# Patient Record
Sex: Female | Born: 1955
Health system: Southern US, Community
[De-identification: ages and names within clinical notes are randomized; demographics above are authoritative.]

---

## 2009-01-28 ENCOUNTER — Encounter: Admission: RE | Admit: 2009-01-28 | Discharge: 2009-01-28 | Payer: Self-pay | Admitting: Gynecology

## 2010-01-29 ENCOUNTER — Encounter: Admission: RE | Admit: 2010-01-29 | Discharge: 2010-01-29 | Payer: Self-pay | Admitting: Gynecology

## 2010-04-13 ENCOUNTER — Encounter: Payer: Self-pay | Admitting: Gynecology

## 2010-12-30 ENCOUNTER — Other Ambulatory Visit: Payer: Self-pay | Admitting: Family Medicine

## 2010-12-30 DIAGNOSIS — N63 Unspecified lump in unspecified breast: Secondary | ICD-10-CM

## 2010-12-30 DIAGNOSIS — R2230 Localized swelling, mass and lump, unspecified upper limb: Secondary | ICD-10-CM

## 2010-12-30 DIAGNOSIS — R1032 Left lower quadrant pain: Secondary | ICD-10-CM

## 2010-12-31 ENCOUNTER — Ambulatory Visit
Admission: RE | Admit: 2010-12-31 | Discharge: 2010-12-31 | Disposition: A | Discharge status: Home / Self Care | Payer: BC Managed Care – PPO | Source: Ambulatory Visit | Attending: Family Medicine | Admitting: Family Medicine

## 2010-12-31 DIAGNOSIS — R1032 Left lower quadrant pain: Secondary | ICD-10-CM

## 2011-01-12 ENCOUNTER — Other Ambulatory Visit: Payer: Self-pay

## 2011-01-12 ENCOUNTER — Other Ambulatory Visit: Payer: Self-pay | Admitting: Gynecology

## 2011-01-12 DIAGNOSIS — Z1231 Encounter for screening mammogram for malignant neoplasm of breast: Secondary | ICD-10-CM

## 2011-02-02 ENCOUNTER — Ambulatory Visit
Admission: RE | Admit: 2011-02-02 | Discharge: 2011-02-02 | Disposition: A | Discharge status: Home / Self Care | Payer: BC Managed Care – PPO | Source: Ambulatory Visit | Attending: Gynecology | Admitting: Gynecology

## 2011-02-02 DIAGNOSIS — Z1231 Encounter for screening mammogram for malignant neoplasm of breast: Secondary | ICD-10-CM

## 2012-01-05 ENCOUNTER — Other Ambulatory Visit: Payer: Self-pay | Admitting: Obstetrics and Gynecology

## 2012-01-05 DIAGNOSIS — Z1231 Encounter for screening mammogram for malignant neoplasm of breast: Secondary | ICD-10-CM

## 2012-02-03 ENCOUNTER — Ambulatory Visit
Admission: RE | Admit: 2012-02-03 | Discharge: 2012-02-03 | Disposition: A | Discharge status: Home / Self Care | Payer: BC Managed Care – PPO | Source: Ambulatory Visit | Attending: Obstetrics and Gynecology | Admitting: Obstetrics and Gynecology

## 2012-02-03 DIAGNOSIS — Z1231 Encounter for screening mammogram for malignant neoplasm of breast: Secondary | ICD-10-CM

## 2012-12-28 ENCOUNTER — Other Ambulatory Visit: Payer: Self-pay

## 2012-12-28 DIAGNOSIS — Z1231 Encounter for screening mammogram for malignant neoplasm of breast: Secondary | ICD-10-CM

## 2013-01-05 ENCOUNTER — Ambulatory Visit (INDEPENDENT_AMBULATORY_CARE_PROVIDER_SITE_OTHER): Payer: BC Managed Care – PPO | Admitting: *Deleted

## 2013-01-05 DIAGNOSIS — Z23 Encounter for immunization: Secondary | ICD-10-CM

## 2013-02-07 ENCOUNTER — Ambulatory Visit
Admission: RE | Admit: 2013-02-07 | Discharge: 2013-02-07 | Disposition: A | Discharge status: Home / Self Care | Payer: BC Managed Care – PPO | Source: Ambulatory Visit

## 2013-02-07 DIAGNOSIS — Z1231 Encounter for screening mammogram for malignant neoplasm of breast: Secondary | ICD-10-CM

## 2013-10-11 ENCOUNTER — Ambulatory Visit (INDEPENDENT_AMBULATORY_CARE_PROVIDER_SITE_OTHER): Payer: BC Managed Care – PPO | Admitting: Physician Assistant

## 2013-10-11 ENCOUNTER — Encounter: Payer: Self-pay | Admitting: Physician Assistant

## 2013-10-11 VITALS — BP 108/70 | HR 68 | Temp 97.6°F | Resp 18 | Ht 64.0 in | Wt 117.0 lb

## 2013-10-11 DIAGNOSIS — R1900 Intra-abdominal and pelvic swelling, mass and lump, unspecified site: Secondary | ICD-10-CM

## 2013-10-11 DIAGNOSIS — H6120 Impacted cerumen, unspecified ear: Secondary | ICD-10-CM

## 2013-10-11 DIAGNOSIS — H6122 Impacted cerumen, left ear: Secondary | ICD-10-CM

## 2013-10-11 LAB — CBC WITH DIFFERENTIAL/PLATELET
BASOS ABS: 0.1 10*3/uL (ref 0.0–0.1)
BASOS PCT: 1 % (ref 0–1)
Eosinophils Absolute: 0.1 10*3/uL (ref 0.0–0.7)
Eosinophils Relative: 1 % (ref 0–5)
HEMATOCRIT: 37.9 % (ref 36.0–46.0)
HEMOGLOBIN: 12.7 g/dL (ref 12.0–15.0)
LYMPHS PCT: 30 % (ref 12–46)
Lymphs Abs: 2.6 10*3/uL (ref 0.7–4.0)
MCH: 29.1 pg (ref 26.0–34.0)
MCHC: 33.5 g/dL (ref 30.0–36.0)
MCV: 86.7 fL (ref 78.0–100.0)
MONO ABS: 0.6 10*3/uL (ref 0.1–1.0)
Monocytes Relative: 7 % (ref 3–12)
NEUTROS ABS: 5.2 10*3/uL (ref 1.7–7.7)
NEUTROS PCT: 61 % (ref 43–77)
Platelets: 283 10*3/uL (ref 150–400)
RBC: 4.37 MIL/uL (ref 3.87–5.11)
RDW: 13.3 % (ref 11.5–15.5)
WBC: 8.5 10*3/uL (ref 4.0–10.5)

## 2013-10-11 LAB — COMPLETE METABOLIC PANEL WITH GFR
ALBUMIN: 4.2 g/dL (ref 3.5–5.2)
ALK PHOS: 68 U/L (ref 39–117)
ALT: 12 U/L (ref 0–35)
AST: 17 U/L (ref 0–37)
BUN: 12 mg/dL (ref 6–23)
CALCIUM: 9.7 mg/dL (ref 8.4–10.5)
CHLORIDE: 101 meq/L (ref 96–112)
CO2: 26 mEq/L (ref 19–32)
Creat: 0.87 mg/dL (ref 0.50–1.10)
GFR, Est African American: 84 mL/min
GFR, Est Non African American: 73 mL/min
Glucose, Bld: 101 mg/dL — ABNORMAL HIGH (ref 70–99)
POTASSIUM: 4.2 meq/L (ref 3.5–5.3)
SODIUM: 136 meq/L (ref 135–145)
TOTAL PROTEIN: 6.5 g/dL (ref 6.0–8.3)
Total Bilirubin: 0.4 mg/dL (ref 0.2–1.2)

## 2013-10-11 NOTE — Progress Notes (Signed)
Patient ID: Stacy Day MRN: [id number], DOB: 17-Feb-1956, 58 y.o. Date of Encounter: @DATE @  Chief Complaint:  Chief Complaint  Patient presents with  . Otalgia    feels like there is fluid in it.  . right hip issue    felt bulge in when shower and sometimes moves to navel    HPI: 58 y.o. year old white female  presents with above concerns.  She says that > 2 months ago she had symptoms of allergies or a cold. Says that all the symptoms resolved except that her left ear has felt clogged off and on since then. Also notes that she has had to come here and get wax cleaned from her ears in the past.  Also reports that about 1-1/2 weeks ago she was in the shower and noticed that the right side of her abdomen seemed to protrude farther out than the left side. She has not been able to palpate any distinct mass. Has had no pain in the area. Has had no change in her bowel habits. No hematochezia and no melena. No significant change in her weight or unintentional weight loss. Says that she has had multiple colonoscopies.   History reviewed. No pertinent past medical history.   Home Meds: No outpatient prescriptions prior to visit.   No facility-administered medications prior to visit.    Allergies: No Known Allergies  History   Social History  . Marital Status: Single    Spouse Name: N/A    Number of Children: N/A  . Years of Education: N/A   Occupational History  . Not on file.   Social History Main Topics  . Smoking status: Never Smoker   . Smokeless tobacco: Not on file  . Alcohol Use: Not on file  . Drug Use: Not on file  . Sexual Activity: Not on file   Other Topics Concern  . Not on file   Social History Narrative  . No narrative on file    No family history on file.   Review of Systems:  See HPI for pertinent ROS. All other ROS negative.    Physical Exam: Blood pressure 108/70, pulse 68, temperature 97.6 F (36.4 C), temperature source Oral, resp. rate  18, height 5\' 4"  (1.626 m), weight 117 lb (53.071 kg)., Body mass index is 20.07 kg/(m^2). General: WNWD WF. Appears in no acute distress. Head: Normocephalic, atraumatic, eyes without discharge, sclera non-icteric, nares are without discharge.  Right Ear: Canal is patent and normal. Tympanic membrane is normal. Left ear: Canal is obstructed by cerumen. Neck: Supple. No thyromegaly. No lymphadenopathy. Lungs: Clear bilaterally to auscultation without wheezes, rales, or rhonchi. Breathing is unlabored. Heart: RRR with S1 S2. No murmurs, rubs, or gallops. Abdomen: Soft, non-tender, non-distended with normoactive bowel sounds. No hepatomegaly. No rebound/guarding. With palpation, I palpate no mass.  However, on inspection, with pt standing, the right side of her abdomen does protrude out further than on the left side.  Musculoskeletal:  Strength and tone normal for age. Forward Bend: No scoliosis. Appears symmetrical from the back. I see no sign of leg length descrepency, etc.  Extremities/Skin: Warm and dry. No edema.  Neuro: Alert and oriented X 3. Moves all extremities spontaneously. Gait is normal. CNII-XII grossly in tact. Psych:  Responds to questions appropriately with a normal affect.     ASSESSMENT AND PLAN:  58 y.o. year old female with  1. Mass of abdomen Discussed that it could be that just her organs and intestines  are shifted towards the right. However we'll get CT to evaluate. - CBC with Differential - COMPLETE METABOLIC PANEL WITH GFR - CT Abdomen Pelvis W Contrast; Future  2. Cerumen impaction, left Irrigated here in the office. Can use over-the-counter drops in the future to prevent recurrence.  [address] [place], [place], [facility] 10/11/2013 1:46 PM

## 2013-10-20 ENCOUNTER — Ambulatory Visit
Admission: RE | Admit: 2013-10-20 | Discharge: 2013-10-20 | Disposition: A | Discharge status: Home / Self Care | Payer: BC Managed Care – PPO | Source: Ambulatory Visit | Attending: Physician Assistant | Admitting: Physician Assistant

## 2013-10-20 ENCOUNTER — Other Ambulatory Visit: Payer: BC Managed Care – PPO

## 2013-10-20 DIAGNOSIS — R1900 Intra-abdominal and pelvic swelling, mass and lump, unspecified site: Secondary | ICD-10-CM

## 2013-10-20 MED ORDER — IOHEXOL 300 MG/ML  SOLN
100.0000 mL | Freq: Once | INTRAMUSCULAR | Status: AC | PRN
  Administered 2013-10-20: 100 mL via INTRAVENOUS

## 2013-10-23 ENCOUNTER — Other Ambulatory Visit: Payer: Self-pay | Admitting: Physician Assistant

## 2013-10-23 DIAGNOSIS — K7689 Other specified diseases of liver: Secondary | ICD-10-CM

## 2014-01-05 ENCOUNTER — Other Ambulatory Visit: Payer: Self-pay

## 2014-01-05 DIAGNOSIS — Z1231 Encounter for screening mammogram for malignant neoplasm of breast: Secondary | ICD-10-CM

## 2014-01-18 ENCOUNTER — Ambulatory Visit (INDEPENDENT_AMBULATORY_CARE_PROVIDER_SITE_OTHER): Payer: BC Managed Care – PPO | Admitting: *Deleted

## 2014-01-18 DIAGNOSIS — Z23 Encounter for immunization: Secondary | ICD-10-CM

## 2014-01-18 NOTE — Progress Notes (Signed)
Patient ID: Stacy Day, female   DOB: Jan 11, 1956, 58 y.o.   MRN: [id number] Patient seen in office for Influenza Vaccination.   Tolerated IM administration well.

## 2014-02-09 ENCOUNTER — Ambulatory Visit
Admission: RE | Admit: 2014-02-09 | Discharge: 2014-02-09 | Disposition: A | Discharge status: Home / Self Care | Payer: BC Managed Care – PPO | Source: Ambulatory Visit

## 2014-02-09 DIAGNOSIS — Z1231 Encounter for screening mammogram for malignant neoplasm of breast: Secondary | ICD-10-CM

## 2014-08-15 LAB — HM PAP SMEAR: HM PAP: NEGATIVE

## 2014-08-27 ENCOUNTER — Encounter: Payer: Self-pay | Admitting: Physician Assistant

## 2015-01-10 ENCOUNTER — Ambulatory Visit (INDEPENDENT_AMBULATORY_CARE_PROVIDER_SITE_OTHER): Payer: BLUE CROSS/BLUE SHIELD | Admitting: *Deleted

## 2015-01-10 DIAGNOSIS — Z23 Encounter for immunization: Secondary | ICD-10-CM

## 2015-01-10 NOTE — Progress Notes (Signed)
Patient ID: Stacy Day, female   DOB: [birth year], 59 y.o.   MRN: [id number]  Patient seen in office for Influenza Vaccination.   Tolerated IM administration well.   Immunization history updated.

## 2015-01-11 ENCOUNTER — Other Ambulatory Visit: Payer: Self-pay

## 2015-01-11 DIAGNOSIS — Z1231 Encounter for screening mammogram for malignant neoplasm of breast: Secondary | ICD-10-CM

## 2015-02-11 ENCOUNTER — Ambulatory Visit: Payer: Self-pay

## 2015-02-18 ENCOUNTER — Ambulatory Visit
Admission: RE | Admit: 2015-02-18 | Discharge: 2015-02-18 | Disposition: A | Discharge status: Home / Self Care | Payer: BLUE CROSS/BLUE SHIELD | Source: Ambulatory Visit

## 2015-02-18 DIAGNOSIS — Z1231 Encounter for screening mammogram for malignant neoplasm of breast: Secondary | ICD-10-CM

## 2015-12-16 DIAGNOSIS — H40011 Open angle with borderline findings, low risk, right eye: Secondary | ICD-10-CM | POA: Diagnosis not present

## 2015-12-16 DIAGNOSIS — H524 Presbyopia: Secondary | ICD-10-CM | POA: Diagnosis not present

## 2015-12-16 DIAGNOSIS — H40012 Open angle with borderline findings, low risk, left eye: Secondary | ICD-10-CM | POA: Diagnosis not present

## 2015-12-16 DIAGNOSIS — H2513 Age-related nuclear cataract, bilateral: Secondary | ICD-10-CM | POA: Diagnosis not present

## 2016-01-14 ENCOUNTER — Other Ambulatory Visit: Payer: Self-pay | Admitting: Obstetrics and Gynecology

## 2016-01-14 DIAGNOSIS — Z1231 Encounter for screening mammogram for malignant neoplasm of breast: Secondary | ICD-10-CM

## 2016-01-28 ENCOUNTER — Ambulatory Visit (INDEPENDENT_AMBULATORY_CARE_PROVIDER_SITE_OTHER): Payer: BLUE CROSS/BLUE SHIELD | Admitting: Family Medicine

## 2016-01-28 DIAGNOSIS — Z23 Encounter for immunization: Secondary | ICD-10-CM | POA: Diagnosis not present

## 2016-02-20 ENCOUNTER — Ambulatory Visit
Admission: RE | Admit: 2016-02-20 | Discharge: 2016-02-20 | Disposition: A | Discharge status: Home / Self Care | Payer: BLUE CROSS/BLUE SHIELD | Source: Ambulatory Visit | Attending: Obstetrics and Gynecology | Admitting: Obstetrics and Gynecology

## 2016-02-20 ENCOUNTER — Ambulatory Visit: Payer: BLUE CROSS/BLUE SHIELD

## 2016-02-20 DIAGNOSIS — Z1231 Encounter for screening mammogram for malignant neoplasm of breast: Secondary | ICD-10-CM

## 2016-02-24 ENCOUNTER — Other Ambulatory Visit: Payer: Self-pay | Admitting: Obstetrics and Gynecology

## 2016-02-24 DIAGNOSIS — R928 Other abnormal and inconclusive findings on diagnostic imaging of breast: Secondary | ICD-10-CM

## 2016-02-27 ENCOUNTER — Ambulatory Visit
Admission: RE | Admit: 2016-02-27 | Discharge: 2016-02-27 | Disposition: A | Discharge status: Home / Self Care | Payer: BLUE CROSS/BLUE SHIELD | Source: Ambulatory Visit | Attending: Obstetrics and Gynecology | Admitting: Obstetrics and Gynecology

## 2016-02-27 DIAGNOSIS — R928 Other abnormal and inconclusive findings on diagnostic imaging of breast: Secondary | ICD-10-CM

## 2016-02-27 DIAGNOSIS — R922 Inconclusive mammogram: Secondary | ICD-10-CM | POA: Diagnosis not present

## 2016-03-11 ENCOUNTER — Ambulatory Visit (INDEPENDENT_AMBULATORY_CARE_PROVIDER_SITE_OTHER): Payer: BLUE CROSS/BLUE SHIELD | Admitting: Physician Assistant

## 2016-03-11 ENCOUNTER — Encounter: Payer: Self-pay | Admitting: Physician Assistant

## 2016-03-11 VITALS — BP 100/70 | HR 73 | Temp 97.8°F | Resp 16 | Wt 117.0 lb

## 2016-03-11 DIAGNOSIS — H6122 Impacted cerumen, left ear: Secondary | ICD-10-CM | POA: Diagnosis not present

## 2016-03-11 NOTE — Progress Notes (Signed)
    Patient ID: Stacy Day MRN: [id number], DOB: 1955-04-01, 60 y.o. Date of Encounter: 03/11/2016, 2:34 PM    Chief Complaint:  Chief Complaint  Patient presents with  . wax build up on left ear     HPI: 60 y.o. year old female presents with above.   She states that her left ear has been popping and feeling discomfort recently. Says that it feels exactly like it did when it had wax buildup in it a couple years ago. Says that she has been using over-the-counter drops but does not seem to be working---  symptoms are persisting despite using the drops.  No other concerns to address today.     Home Meds:   No outpatient prescriptions prior to visit.   No facility-administered medications prior to visit.     Allergies: No Known Allergies    Review of Systems: See HPI for pertinent ROS. All other ROS negative.    Physical Exam: Blood pressure 100/70, pulse 73, temperature 97.8 F (36.6 C), temperature source Oral, resp. rate 16, weight 117 lb (53.1 kg), SpO2 97 %., Body mass index is 20.08 kg/m. General: WNWD WF.  Appears in no acute distress. HEENT: Normocephalic, atraumatic, eyes without discharge, sclera non-icteric, nares are without discharge. Right ear canal patent. TM normal. Left ear canal obstructed by cerumen. Oral cavity moist, posterior pharynx without exudate, erythema, peritonsillar abscess.  Neck: Supple. No thyromegaly. No lymphadenopathy. Lungs: Clear bilaterally to auscultation without wheezes, rales, or rhonchi. Breathing is unlabored. Heart: Regular rhythm. No murmurs, rubs, or gallops. Msk:  Strength and tone normal for age. Extremities/Skin: Warm and dry. Neuro: Alert and oriented X 3. Moves all extremities spontaneously. Gait is normal. CNII-XII grossly in tact. Psych:  Responds to questions appropriately with a normal affect.     ASSESSMENT AND PLAN:  60 y.o. year old female with  1. Impacted cerumen of left ear Will irrigate and remove cerumen  now.  Discussed with patient scheduling complete physical exam. She states that she does see her gynecologist routinely and keeps up with mammograms. Also states that her colonoscopy is up-to-date. 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 Discussed doing complete physical exam including fasting labs and she is going to talk to her sitter and call back to schedule.   Marin Olp [place], [place], [facility] 03/11/2016 2:34 PM

## 2016-03-18 ENCOUNTER — Ambulatory Visit: Payer: BLUE CROSS/BLUE SHIELD

## 2016-08-06 DIAGNOSIS — L814 Other melanin hyperpigmentation: Secondary | ICD-10-CM | POA: Diagnosis not present

## 2016-08-06 DIAGNOSIS — L821 Other seborrheic keratosis: Secondary | ICD-10-CM | POA: Diagnosis not present

## 2016-08-06 DIAGNOSIS — D225 Melanocytic nevi of trunk: Secondary | ICD-10-CM | POA: Diagnosis not present

## 2016-08-06 DIAGNOSIS — D1800 Hemangioma unspecified site: Secondary | ICD-10-CM | POA: Diagnosis not present

## 2016-09-09 DIAGNOSIS — Z1389 Encounter for screening for other disorder: Secondary | ICD-10-CM | POA: Diagnosis not present

## 2016-09-09 DIAGNOSIS — Z01419 Encounter for gynecological examination (general) (routine) without abnormal findings: Secondary | ICD-10-CM | POA: Diagnosis not present

## 2016-09-09 DIAGNOSIS — Z130 Encounter for screening for diseases of the blood and blood-forming organs and certain disorders involving the immune mechanism: Secondary | ICD-10-CM | POA: Diagnosis not present

## 2016-09-09 DIAGNOSIS — F419 Anxiety disorder, unspecified: Secondary | ICD-10-CM | POA: Diagnosis not present

## 2016-09-09 DIAGNOSIS — Z6820 Body mass index (BMI) 20.0-20.9, adult: Secondary | ICD-10-CM | POA: Diagnosis not present

## 2016-09-09 DIAGNOSIS — F418 Other specified anxiety disorders: Secondary | ICD-10-CM | POA: Diagnosis not present

## 2016-09-10 DIAGNOSIS — Z1211 Encounter for screening for malignant neoplasm of colon: Secondary | ICD-10-CM | POA: Diagnosis not present

## 2016-09-10 DIAGNOSIS — K52832 Lymphocytic colitis: Secondary | ICD-10-CM | POA: Diagnosis not present

## 2016-09-10 DIAGNOSIS — Z86011 Personal history of benign neoplasm of the brain: Secondary | ICD-10-CM | POA: Diagnosis not present

## 2016-09-16 DIAGNOSIS — Z1389 Encounter for screening for other disorder: Secondary | ICD-10-CM | POA: Diagnosis not present

## 2016-09-28 ENCOUNTER — Ambulatory Visit (INDEPENDENT_AMBULATORY_CARE_PROVIDER_SITE_OTHER): Payer: BLUE CROSS/BLUE SHIELD | Admitting: *Deleted

## 2016-09-28 DIAGNOSIS — Z23 Encounter for immunization: Secondary | ICD-10-CM

## 2016-09-28 NOTE — Progress Notes (Signed)
Patient seen in office for immunization update.   Requested Tdap.   Immunization given.

## 2016-10-12 ENCOUNTER — Encounter: Payer: Self-pay | Admitting: Physician Assistant

## 2016-10-12 DIAGNOSIS — D128 Benign neoplasm of rectum: Secondary | ICD-10-CM | POA: Diagnosis not present

## 2016-10-12 DIAGNOSIS — D125 Benign neoplasm of sigmoid colon: Secondary | ICD-10-CM | POA: Diagnosis not present

## 2016-10-12 DIAGNOSIS — K635 Polyp of colon: Secondary | ICD-10-CM | POA: Diagnosis not present

## 2016-10-12 DIAGNOSIS — Z1211 Encounter for screening for malignant neoplasm of colon: Secondary | ICD-10-CM | POA: Diagnosis not present

## 2016-12-16 DIAGNOSIS — H353132 Nonexudative age-related macular degeneration, bilateral, intermediate dry stage: Secondary | ICD-10-CM | POA: Diagnosis not present

## 2016-12-16 DIAGNOSIS — H40013 Open angle with borderline findings, low risk, bilateral: Secondary | ICD-10-CM | POA: Diagnosis not present

## 2016-12-16 DIAGNOSIS — H2513 Age-related nuclear cataract, bilateral: Secondary | ICD-10-CM | POA: Diagnosis not present

## 2016-12-16 DIAGNOSIS — H524 Presbyopia: Secondary | ICD-10-CM | POA: Diagnosis not present

## 2017-01-05 ENCOUNTER — Ambulatory Visit (INDEPENDENT_AMBULATORY_CARE_PROVIDER_SITE_OTHER): Payer: BLUE CROSS/BLUE SHIELD | Admitting: Family Medicine

## 2017-01-05 DIAGNOSIS — Z23 Encounter for immunization: Secondary | ICD-10-CM | POA: Diagnosis not present

## 2017-01-20 ENCOUNTER — Other Ambulatory Visit: Payer: Self-pay | Admitting: Obstetrics and Gynecology

## 2017-01-20 DIAGNOSIS — Z1231 Encounter for screening mammogram for malignant neoplasm of breast: Secondary | ICD-10-CM

## 2017-02-24 ENCOUNTER — Ambulatory Visit
Admission: RE | Admit: 2017-02-24 | Discharge: 2017-02-24 | Disposition: A | Discharge status: Home / Self Care | Payer: BLUE CROSS/BLUE SHIELD | Source: Ambulatory Visit | Attending: Obstetrics and Gynecology | Admitting: Obstetrics and Gynecology

## 2017-02-24 DIAGNOSIS — Z1231 Encounter for screening mammogram for malignant neoplasm of breast: Secondary | ICD-10-CM

## 2017-03-01 ENCOUNTER — Ambulatory Visit: Payer: BLUE CROSS/BLUE SHIELD

## 2017-07-26 ENCOUNTER — Encounter: Payer: Self-pay | Admitting: Family Medicine

## 2017-07-26 ENCOUNTER — Ambulatory Visit: Payer: BLUE CROSS/BLUE SHIELD | Admitting: Family Medicine

## 2017-07-26 VITALS — BP 122/78 | HR 74 | Temp 98.4°F | Resp 14 | Ht 64.0 in | Wt 121.0 lb

## 2017-07-26 DIAGNOSIS — S0240DA Maxillary fracture, left side, initial encounter for closed fracture: Secondary | ICD-10-CM

## 2017-07-26 NOTE — Progress Notes (Signed)
Subjective:    Patient ID: Stacy Day, female    DOB: Aug 21, 1955, 62 y.o.   MRN: [id number]  HPI  Yesterday, the patient was trying to cut material using a pair of clippers.  She was doing this on her concrete sidewalk.  She was pushing her entire body weight down on the clippers to try to cut through the material.  The clippers gave way and the patient fell landing directly on her left cheek.  She now has significant pain and swelling over her left maxilla and the inferior left orbit.  She has no pain with extraocular movement.  There is no exophthalmos.  She denies any loss of consciousness.  She denies any headache.  She denies any nausea or vomiting.  She denies any rhinorrhea or clear fluid emanating from her nostril or from her ear.  However she has a firm subcutaneous mass that may be a hematoma that is mobile in her left cheek.  I cannot exclude a bone fragment.  There is significant swelling encompassing her entire left cheek and tracking down her left mandible. No past medical history on file. No past surgical history on file. No current outpatient medications on file prior to visit.   No current facility-administered medications on file prior to visit.    No Known Allergies Social History   Socioeconomic History  . Marital status: Single    Spouse name: Not on file  . Number of children: Not on file  . Years of education: Not on file  . Highest education level: Not on file  Occupational History  . Not on file  Social Needs  . Financial resource strain: Not on file  . Food insecurity:    Worry: Not on file    Inability: Not on file  . Transportation needs:    Medical: Not on file    Non-medical: Not on file  Tobacco Use  . Smoking status: Never Smoker  . Smokeless tobacco: Never Used  Substance and Sexual Activity  . Alcohol use: Never    Frequency: Never  . Drug use: Never  . Sexual activity: Not on file  Lifestyle  . Physical activity:    Days per week: Not on  file    Minutes per session: Not on file  . Stress: Not on file  Relationships  . Social connections:    Talks on phone: Not on file    Gets together: Not on file    Attends religious service: Not on file    Active member of club or organization: Not on file    Attends meetings of clubs or organizations: Not on file    Relationship status: Not on file  . Intimate partner violence:    Fear of current or ex partner: Not on file    Emotionally abused: Not on file    Physically abused: Not on file    Forced sexual activity: Not on file  Other Topics Concern  . Not on file  Social History Narrative  . Not on file       Review of Systems  All other systems reviewed and are negative.      Objective:   Physical Exam  Constitutional: She appears well-developed and well-nourished. No distress.  HENT:  Head: Head is with contusion and with laceration.    Nose: Left sinus exhibits maxillary sinus tenderness.  Skin: She is not diaphoretic.  Vitals reviewed.         Assessment & Plan:  Maxillary  fracture, left side, initial encounter for closed fracture ([place]) - Plan: DG Skull Complete, CT Maxillofacial W/Cm  Given the pain and swelling and ecchymosis, I am almost certain the patient has sustained a maxillary fracture.  There also appears to be a firm loose body in her left cheek that I suspect may be a bone fragment.  Obtain a CT scan of the maxillofacial area to evaluate further and to stage the fracture to determine any management if necessary.  Meanwhile apply ice 3-4 times a day for 15 minutes to help control swelling.  Avoid applying ice directly to the skin.

## 2017-07-27 ENCOUNTER — Ambulatory Visit
Admission: RE | Admit: 2017-07-27 | Discharge: 2017-07-27 | Disposition: A | Discharge status: Home / Self Care | Payer: BLUE CROSS/BLUE SHIELD | Source: Ambulatory Visit | Attending: Family Medicine | Admitting: Family Medicine

## 2017-07-27 DIAGNOSIS — S0240DA Maxillary fracture, left side, initial encounter for closed fracture: Secondary | ICD-10-CM

## 2017-07-27 DIAGNOSIS — S0083XA Contusion of other part of head, initial encounter: Secondary | ICD-10-CM | POA: Diagnosis not present

## 2017-07-27 MED ORDER — IOPAMIDOL (ISOVUE-300) INJECTION 61%
75.0000 mL | Freq: Once | INTRAVENOUS | Status: AC | PRN
  Administered 2017-07-27: 75 mL via INTRAVENOUS

## 2017-08-24 ENCOUNTER — Encounter: Payer: Self-pay | Admitting: Family Medicine

## 2017-08-24 ENCOUNTER — Other Ambulatory Visit: Payer: Self-pay

## 2017-08-24 ENCOUNTER — Ambulatory Visit: Payer: BLUE CROSS/BLUE SHIELD | Admitting: Family Medicine

## 2017-08-24 VITALS — BP 104/78 | HR 69 | Temp 98.0°F | Resp 14 | Ht 64.0 in | Wt 122.0 lb

## 2017-08-24 DIAGNOSIS — L309 Dermatitis, unspecified: Secondary | ICD-10-CM

## 2017-08-24 DIAGNOSIS — R6889 Other general symptoms and signs: Secondary | ICD-10-CM | POA: Diagnosis not present

## 2017-08-24 MED ORDER — TACROLIMUS 0.1 % EX OINT: TOPICAL_OINTMENT | Freq: Two times a day (BID) | CUTANEOUS | 0 refills | Status: DC

## 2017-08-24 MED ORDER — CROMOLYN SODIUM 4 % OP SOLN: 1.0000 [drp] | Freq: Four times a day (QID) | OPHTHALMIC | 12 refills | Status: DC

## 2017-08-24 MED ORDER — MONTELUKAST SODIUM 10 MG PO TABS: 10.0000 mg | ORAL_TABLET | Freq: Every day | ORAL | 1 refills | Status: DC

## 2017-08-24 NOTE — Progress Notes (Signed)
Patient ID: Stacy Day, female    DOB: Oct 19, 1955, 62 y.o.   MRN: [id number]  PCP: Orlena Sheldon, PA-C  Chief Complaint  Patient presents with  . eyelids puffy    redness and peeling     Subjective:   Stacy Day is a 62 y.o. female, presents to clinic with CC of 2-3 months of itchy eyes, seasonal allergies and gradual worsening of thickened, peeling and red skin to bilateral upper eyelids that have intermittently swollen.  She did have itchy ears, sneezing, nasal drainage, watery eyes that have come and go, believe its seasonal allergies.  Most of the nasal symptoms have relieved but eye became swollen.  No improvement with antibiotic ointment applied to upper eyelids.  She started 24 hour antihistamine that made some of there symptoms better.   She denies fever, nasal congestion, sinus pain/pressure, red conjunctiva, purulent eye drainage, eye pain, photophobia, visual disturbances.     There are no active problems to display for this patient.    Prior to Admission medications   Not on File     No Known Allergies   Family History  Problem Relation Age of Onset  . Breast cancer Neg Hx      Social History   Socioeconomic History  . Marital status: Single    Spouse name: Not on file  . Number of children: Not on file  . Years of education: Not on file  . Highest education level: Not on file  Occupational History  . Not on file  Social Needs  . Financial resource strain: Not on file  . Food insecurity:    Worry: Not on file    Inability: Not on file  . Transportation needs:    Medical: Not on file    Non-medical: Not on file  Tobacco Use  . Smoking status: Never Smoker  . Smokeless tobacco: Never Used  Substance and Sexual Activity  . Alcohol use: Never    Frequency: Never  . Drug use: Never  . Sexual activity: Not on file  Lifestyle  . Physical activity:    Days per week: Not on file    Minutes per session: Not on file  . Stress: Not on file    Relationships  . Social connections:    Talks on phone: Not on file    Gets together: Not on file    Attends religious service: Not on file    Active member of club or organization: Not on file    Attends meetings of clubs or organizations: Not on file    Relationship status: Not on file  . Intimate partner violence:    Fear of current or ex partner: Not on file    Emotionally abused: Not on file    Physically abused: Not on file    Forced sexual activity: Not on file  Other Topics Concern  . Not on file  Social History Narrative  . Not on file     Review of Systems  All other systems reviewed and are negative.      Objective:    Vitals:   08/24/17 1132  BP: 104/78  Pulse: 69  Resp: 14  Temp: 98 F (36.7 C)  TempSrc: Oral  SpO2: 98%  Weight: 122 lb (55.3 kg)  Height: 5\' 4"  (1.626 m)      Physical Exam  Constitutional: She appears well-developed and well-nourished. No distress.  HENT:  Head: Normocephalic and atraumatic. Head is without raccoon's eyes, without  Battle's sign, without right periorbital erythema and without left periorbital erythema.  Nose: Mucosal edema present. No rhinorrhea. No epistaxis. Right sinus exhibits no maxillary sinus tenderness and no frontal sinus tenderness. Left sinus exhibits no maxillary sinus tenderness and no frontal sinus tenderness.  Mouth/Throat: Uvula is midline, oropharynx is clear and moist and mucous membranes are normal. No trismus in the jaw. No uvula swelling. No oropharyngeal exudate, posterior oropharyngeal edema, posterior oropharyngeal erythema or tonsillar abscesses.  Mildly swollen inner upper eyelids b/l, also inner corners of eyes with mild redness and thickened peeling skin No periorbital edema or erythema   Eyes: Pupils are equal, round, and reactive to light. Conjunctivae and EOM are normal. Right eye exhibits no chemosis, no discharge, no exudate and no hordeolum. Left eye exhibits no chemosis, no discharge,  no exudate and no hordeolum. Right conjunctiva is not injected. Right conjunctiva has no hemorrhage. Left conjunctiva is not injected. Left conjunctiva has no hemorrhage. No scleral icterus.  Neck: No tracheal deviation present.  Cardiovascular: Normal rate and regular rhythm.  Pulmonary/Chest: Effort normal. No stridor. No respiratory distress.  Musculoskeletal: Normal range of motion.  Neurological: She is alert. She exhibits normal muscle tone. Coordination normal.  Skin: Skin is warm and dry. No rash noted. She is not diaphoretic.  Psychiatric: She has a normal mood and affect. Her behavior is normal.  Nursing note and vitals reviewed.         Assessment & Plan:      ICD-10-CM   1. Itchy eyes R68.89 montelukast (SINGULAIR) 10 MG tablet    cromolyn (OPTICROM) 4 % ophthalmic solution    tacrolimus (PROTOPIC) 0.1 % ointment  2. Dermatitis L30.9 montelukast (SINGULAIR) 10 MG tablet    cromolyn (OPTICROM) 4 % ophthalmic solution    tacrolimus (PROTOPIC) 0.1 % ointment    Upper eyelid and inner corner of eye with irritation, mild erythema and peeling, looks like lichenification/dermatitis.  No concern of infection of eyelids or eyes, no conjunctivitis, normal EOM's PERRLA and no vision changes.  Will maximize medical allergy control, try allergy eye drops, and tacrolimus - per UTD recommendations.  Discussed with patient having get ophthalmology eval and management if she requires topical steroids.  She is established with them, will contact.   Stacy Grana, PA-C 08/24/17 12:25 PM

## 2017-08-24 NOTE — Patient Instructions (Signed)
First line treatment is to control allergies -continue 24-hour antihistamine daily and add montelukast daily  Can use cromolyn eyedrops if you have any itching in eyes  At this point the thickened skin with flaking has become it on problem similar to atopic dermatitis or eczema.  We will try this topical ointment which shown to be effective with your symptoms.   Apply thin layer of 0.03% or 0.1% ointment to affected area twice daily; rub in gently and completely  Is ointment can be used up to 6 weeks, and then he need to be rechecked.  If your symptoms are not beginning to improve and roughly 2 weeks I would call us so we can refer you to an ophthalmologist because you will need ophthalmology to prescribe and manage any topical steroids needed on your upper eyelids

## 2017-08-27 ENCOUNTER — Telehealth: Payer: Self-pay | Admitting: Physician Assistant

## 2017-08-27 DIAGNOSIS — H10413 Chronic giant papillary conjunctivitis, bilateral: Secondary | ICD-10-CM | POA: Diagnosis not present

## 2017-08-27 NOTE — Telephone Encounter (Signed)
PA for tacromlimus (Protopic) ointment submitted on cover my meds. Currently pending review

## 2017-09-01 ENCOUNTER — Other Ambulatory Visit: Payer: Self-pay | Admitting: Family Medicine

## 2017-09-01 NOTE — Telephone Encounter (Signed)
Protopic was denied. Patient would like to know if anything else can be called in or what other options does she have. Please advise?

## 2017-09-02 ENCOUNTER — Telehealth: Payer: Self-pay | Admitting: Family Medicine

## 2017-09-02 NOTE — Telephone Encounter (Signed)
I called and spoke to Ms. Conkel.Pt has been to her ophthalmologist to manage this.Given a steroid drop for in eyes.She is going to follow up with him again at the end of the month.

## 2017-09-16 DIAGNOSIS — H10413 Chronic giant papillary conjunctivitis, bilateral: Secondary | ICD-10-CM | POA: Diagnosis not present

## 2017-09-21 DIAGNOSIS — Z01419 Encounter for gynecological examination (general) (routine) without abnormal findings: Secondary | ICD-10-CM | POA: Diagnosis not present

## 2017-09-21 DIAGNOSIS — D225 Melanocytic nevi of trunk: Secondary | ICD-10-CM | POA: Diagnosis not present

## 2017-09-21 DIAGNOSIS — Z124 Encounter for screening for malignant neoplasm of cervix: Secondary | ICD-10-CM | POA: Diagnosis not present

## 2017-09-21 DIAGNOSIS — Z1389 Encounter for screening for other disorder: Secondary | ICD-10-CM | POA: Diagnosis not present

## 2017-09-21 DIAGNOSIS — L821 Other seborrheic keratosis: Secondary | ICD-10-CM | POA: Diagnosis not present

## 2017-09-21 DIAGNOSIS — Z6821 Body mass index (BMI) 21.0-21.9, adult: Secondary | ICD-10-CM | POA: Diagnosis not present

## 2017-09-21 DIAGNOSIS — L814 Other melanin hyperpigmentation: Secondary | ICD-10-CM | POA: Diagnosis not present

## 2017-09-21 DIAGNOSIS — L820 Inflamed seborrheic keratosis: Secondary | ICD-10-CM | POA: Diagnosis not present

## 2017-10-07 DIAGNOSIS — Z0000 Encounter for general adult medical examination without abnormal findings: Secondary | ICD-10-CM | POA: Diagnosis not present

## 2017-11-02 DIAGNOSIS — H10413 Chronic giant papillary conjunctivitis, bilateral: Secondary | ICD-10-CM | POA: Diagnosis not present

## 2017-12-16 ENCOUNTER — Other Ambulatory Visit: Payer: Self-pay | Admitting: Podiatry

## 2017-12-16 ENCOUNTER — Ambulatory Visit (INDEPENDENT_AMBULATORY_CARE_PROVIDER_SITE_OTHER): Payer: BLUE CROSS/BLUE SHIELD

## 2017-12-16 ENCOUNTER — Ambulatory Visit: Payer: BLUE CROSS/BLUE SHIELD | Admitting: Podiatry

## 2017-12-16 ENCOUNTER — Encounter: Payer: Self-pay | Admitting: Podiatry

## 2017-12-16 VITALS — BP 117/61 | HR 71 | Resp 16

## 2017-12-16 DIAGNOSIS — M84371A Stress fracture, right ankle, initial encounter for fracture: Secondary | ICD-10-CM

## 2017-12-16 DIAGNOSIS — M79671 Pain in right foot: Secondary | ICD-10-CM | POA: Diagnosis not present

## 2017-12-16 DIAGNOSIS — N898 Other specified noninflammatory disorders of vagina: Secondary | ICD-10-CM | POA: Insufficient documentation

## 2017-12-16 DIAGNOSIS — L84 Corns and callosities: Secondary | ICD-10-CM | POA: Diagnosis not present

## 2017-12-16 DIAGNOSIS — R319 Hematuria, unspecified: Secondary | ICD-10-CM | POA: Insufficient documentation

## 2017-12-16 DIAGNOSIS — M2012 Hallux valgus (acquired), left foot: Secondary | ICD-10-CM | POA: Diagnosis not present

## 2017-12-16 DIAGNOSIS — N644 Mastodynia: Secondary | ICD-10-CM | POA: Insufficient documentation

## 2017-12-16 DIAGNOSIS — F419 Anxiety disorder, unspecified: Secondary | ICD-10-CM | POA: Insufficient documentation

## 2017-12-16 DIAGNOSIS — M84374A Stress fracture, right foot, initial encounter for fracture: Secondary | ICD-10-CM

## 2017-12-16 MED ORDER — MELOXICAM 15 MG PO TABS: 15.0000 mg | ORAL_TABLET | Freq: Every day | ORAL | 2 refills | Status: DC

## 2017-12-16 NOTE — Progress Notes (Signed)
   Subjective:    Patient ID: Stacy Day, female    DOB: 12-Feb-1956, 62 y.o.   MRN: [id number]  HPI    Review of Systems  All other systems reviewed and are negative.      Objective:   Physical Exam        Assessment & Plan:

## 2017-12-16 NOTE — Patient Instructions (Signed)

## 2017-12-16 NOTE — Progress Notes (Signed)
Subjective:   Patient ID: Stacy Day, female   DOB: 62 y.o.   MRN: [id number]   HPI Patient presents stating that she is had some pain on the top of the foot right for the last few weeks has chronic lesions on the bottom of both feet and chronic bunion deformity which is not significantly painful but does cause changes in shoe gear.  Patient states she walks 3 miles per day and she does not smoke   Review of Systems  All other systems reviewed and are negative.       Objective:  Physical Exam  Constitutional: She appears well-developed and well-nourished.  Cardiovascular: Intact distal pulses.  Pulmonary/Chest: Effort normal.  Musculoskeletal: Normal range of motion.  Neurological: She is alert.  Skin: Skin is warm.  Nursing note and vitals reviewed.   Neurovascular status was found to be intact muscle strength adequate range of motion within normal limits.  Patient's found to have mild discomfort dorsal second metatarsal right with the mid shaft area and has keratotic lesion of the plantar second metatarsal and distal bilateral that is mildly tender when pressed with structural bunion deformity noted bilateral with redness around the first metatarsal head but minimal discomfort.  Has good digital perfusion well oriented x3     Assessment:  Probability for some form of low-grade inflammatory process dorsal right with slight possibility for stress fracture with structural bunion deformity bilateral and keratotic lesion with porokeratotic type lesion formation     Plan:  H&P x-rays reviewed and today sharp debridement accomplished of the lesions with no iatrogenic bleeding and advised on ice therapy dorsal right and placed on Mobic 15 mg daily for several weeks.  Also advised on rigid type bottom shoes and if symptoms were to get worse I want to see her right away we will consider utilization of boot therapy.  Patient will be seen back to recheck  X-rays indicate significant  structural bunion deformity bilateral with no current indication stress fracture second metatarsal right

## 2017-12-20 DIAGNOSIS — H2513 Age-related nuclear cataract, bilateral: Secondary | ICD-10-CM | POA: Diagnosis not present

## 2017-12-20 DIAGNOSIS — H40013 Open angle with borderline findings, low risk, bilateral: Secondary | ICD-10-CM | POA: Diagnosis not present

## 2017-12-20 DIAGNOSIS — H5212 Myopia, left eye: Secondary | ICD-10-CM | POA: Diagnosis not present

## 2017-12-20 DIAGNOSIS — H353132 Nonexudative age-related macular degeneration, bilateral, intermediate dry stage: Secondary | ICD-10-CM | POA: Diagnosis not present

## 2017-12-24 ENCOUNTER — Ambulatory Visit (INDEPENDENT_AMBULATORY_CARE_PROVIDER_SITE_OTHER): Payer: BLUE CROSS/BLUE SHIELD

## 2017-12-24 DIAGNOSIS — Z23 Encounter for immunization: Secondary | ICD-10-CM | POA: Diagnosis not present

## 2017-12-24 NOTE — Progress Notes (Signed)
Patient came in today to receive annual flu vaccine. Patient was given fluarix in the left delotid. Patient tolerated well. VIS given.

## 2018-01-06 ENCOUNTER — Ambulatory Visit: Payer: BLUE CROSS/BLUE SHIELD | Admitting: Podiatry

## 2018-01-18 ENCOUNTER — Other Ambulatory Visit: Payer: Self-pay | Admitting: Obstetrics and Gynecology

## 2018-01-18 DIAGNOSIS — Z1231 Encounter for screening mammogram for malignant neoplasm of breast: Secondary | ICD-10-CM

## 2018-02-10 DIAGNOSIS — H25812 Combined forms of age-related cataract, left eye: Secondary | ICD-10-CM | POA: Diagnosis not present

## 2018-02-10 DIAGNOSIS — H2181 Floppy iris syndrome: Secondary | ICD-10-CM | POA: Diagnosis not present

## 2018-02-10 DIAGNOSIS — H2512 Age-related nuclear cataract, left eye: Secondary | ICD-10-CM | POA: Diagnosis not present

## 2018-02-10 DIAGNOSIS — H25042 Posterior subcapsular polar age-related cataract, left eye: Secondary | ICD-10-CM | POA: Diagnosis not present

## 2018-02-28 ENCOUNTER — Ambulatory Visit
Admission: RE | Admit: 2018-02-28 | Discharge: 2018-02-28 | Disposition: A | Discharge status: Home / Self Care | Payer: BLUE CROSS/BLUE SHIELD | Source: Ambulatory Visit | Attending: Obstetrics and Gynecology | Admitting: Obstetrics and Gynecology

## 2018-02-28 DIAGNOSIS — Z1231 Encounter for screening mammogram for malignant neoplasm of breast: Secondary | ICD-10-CM | POA: Diagnosis not present

## 2018-03-08 DIAGNOSIS — H353122 Nonexudative age-related macular degeneration, left eye, intermediate dry stage: Secondary | ICD-10-CM | POA: Diagnosis not present

## 2018-03-17 DIAGNOSIS — D485 Neoplasm of uncertain behavior of skin: Secondary | ICD-10-CM | POA: Diagnosis not present

## 2018-03-17 DIAGNOSIS — L820 Inflamed seborrheic keratosis: Secondary | ICD-10-CM | POA: Diagnosis not present

## 2018-03-17 DIAGNOSIS — Z23 Encounter for immunization: Secondary | ICD-10-CM | POA: Diagnosis not present

## 2018-09-26 DIAGNOSIS — L821 Other seborrheic keratosis: Secondary | ICD-10-CM | POA: Diagnosis not present

## 2018-09-26 DIAGNOSIS — D2372 Other benign neoplasm of skin of left lower limb, including hip: Secondary | ICD-10-CM | POA: Diagnosis not present

## 2018-09-26 DIAGNOSIS — L814 Other melanin hyperpigmentation: Secondary | ICD-10-CM | POA: Diagnosis not present

## 2018-09-26 DIAGNOSIS — D225 Melanocytic nevi of trunk: Secondary | ICD-10-CM | POA: Diagnosis not present

## 2018-09-26 DIAGNOSIS — D485 Neoplasm of uncertain behavior of skin: Secondary | ICD-10-CM | POA: Diagnosis not present

## 2018-10-31 DIAGNOSIS — Z130 Encounter for screening for diseases of the blood and blood-forming organs and certain disorders involving the immune mechanism: Secondary | ICD-10-CM | POA: Diagnosis not present

## 2018-10-31 DIAGNOSIS — Z6821 Body mass index (BMI) 21.0-21.9, adult: Secondary | ICD-10-CM | POA: Diagnosis not present

## 2018-10-31 DIAGNOSIS — Z1389 Encounter for screening for other disorder: Secondary | ICD-10-CM | POA: Diagnosis not present

## 2018-10-31 DIAGNOSIS — Z01419 Encounter for gynecological examination (general) (routine) without abnormal findings: Secondary | ICD-10-CM | POA: Diagnosis not present

## 2018-12-30 ENCOUNTER — Ambulatory Visit (INDEPENDENT_AMBULATORY_CARE_PROVIDER_SITE_OTHER): Payer: BC Managed Care – PPO

## 2018-12-30 DIAGNOSIS — Z23 Encounter for immunization: Secondary | ICD-10-CM

## 2019-01-10 DIAGNOSIS — H40013 Open angle with borderline findings, low risk, bilateral: Secondary | ICD-10-CM | POA: Diagnosis not present

## 2019-01-10 DIAGNOSIS — H2511 Age-related nuclear cataract, right eye: Secondary | ICD-10-CM | POA: Diagnosis not present

## 2019-01-10 DIAGNOSIS — H353132 Nonexudative age-related macular degeneration, bilateral, intermediate dry stage: Secondary | ICD-10-CM | POA: Diagnosis not present

## 2019-01-10 DIAGNOSIS — H524 Presbyopia: Secondary | ICD-10-CM | POA: Diagnosis not present

## 2019-01-23 ENCOUNTER — Other Ambulatory Visit: Payer: Self-pay

## 2019-01-23 ENCOUNTER — Encounter: Payer: Self-pay | Admitting: Family Medicine

## 2019-01-23 ENCOUNTER — Ambulatory Visit: Payer: BC Managed Care – PPO | Admitting: Family Medicine

## 2019-01-23 VITALS — BP 110/80 | HR 76 | Temp 97.0°F | Resp 16 | Ht 64.0 in | Wt 123.0 lb

## 2019-01-23 DIAGNOSIS — H8110 Benign paroxysmal vertigo, unspecified ear: Secondary | ICD-10-CM

## 2019-01-23 MED ORDER — MECLIZINE HCL 25 MG PO TABS: 25.0000 mg | ORAL_TABLET | Freq: Three times a day (TID) | ORAL | 0 refills | Status: DC | PRN

## 2019-01-23 NOTE — Progress Notes (Signed)
Subjective:    Patient ID: Stacy Day, female    DOB: 16-Apr-1955, 63 y.o.   MRN: [id number]  HPI Patient is here today complaining of dizziness.  She states that she woke up this morning.  She sat up to get out of bed and the room began spinning.  She felt extremely nauseated and lightheaded.  Symptoms gradually improved.  However she thought maybe this was due to having wax in her ears so she put some drops in her ears and then laid on her side to allow the drops to work.  When she laid down on her side, the vertigo returned.  This was the most severe.  She denies any headache.  She denies any hearing loss.  She does have a cerumen impaction in her right auditory canal.  She denies any tinnitus.  She denies any sinus pain.  She denies any other neurologic deficit.  Dix-Hallpike maneuver is negative bilaterally No past medical history on file. No past surgical history on file. Current Outpatient Medications on File Prior to Visit  Medication Sig Dispense Refill  . ALPRAZolam (XANAX) 0.5 MG tablet alprazolam 0.5 mg tablet  TAKE 1 TABLET BY MOUTH ONCE DAILY AS NEEDED FOR 15 DAYS    . Cholecalciferol (VITAMIN D PO) Take 1 tablet by mouth daily.    . cromolyn (OPTICROM) 4 % ophthalmic solution Place 1 drop into both eyes 4 (four) times daily. 10 mL 12  . Fexofenadine HCl (ALLEGRA ALLERGY PO) Take 1 tablet by mouth as needed.    . Flaxseed, Linseed, (FLAX SEED OIL PO) Take 1 tablet by mouth daily.    . meloxicam (MOBIC) 15 MG tablet Take 1 tablet (15 mg total) by mouth daily. 30 tablet 2  . montelukast (SINGULAIR) 10 MG tablet Take 1 tablet (10 mg total) by mouth at bedtime. 30 tablet 1  . tacrolimus (PROTOPIC) 0.1 % ointment Apply topically 2 (two) times daily. 100 g 0   No current facility-administered medications on file prior to visit.    No Known Allergies Social History   Socioeconomic History  . Marital status: Single    Spouse name: Not on file  . Number of children: Not on file  .  Years of education: Not on file  . Highest education level: Not on file  Occupational History  . Not on file  Social Needs  . Financial resource strain: Not on file  . Food insecurity    Worry: Not on file    Inability: Not on file  . Transportation needs    Medical: Not on file    Non-medical: Not on file  Tobacco Use  . Smoking status: Never Smoker  . Smokeless tobacco: Never Used  Substance and Sexual Activity  . Alcohol use: Never    Frequency: Never  . Drug use: Never  . Sexual activity: Not on file  Lifestyle  . Physical activity    Days per week: Not on file    Minutes per session: Not on file  . Stress: Not on file  Relationships  . Social Herbalist on phone: Not on file    Gets together: Not on file    Attends religious service: Not on file    Active member of club or organization: Not on file    Attends meetings of clubs or organizations: Not on file    Relationship status: Not on file  . Intimate partner violence    Fear of current or ex  partner: Not on file    Emotionally abused: Not on file    Physically abused: Not on file    Forced sexual activity: Not on file  Other Topics Concern  . Not on file  Social History Narrative  . Not on file      Review of Systems  All other systems reviewed and are negative.      Objective:   Physical Exam Vitals signs reviewed.  Constitutional:      General: She is not in acute distress.    Appearance: Normal appearance. She is not ill-appearing or toxic-appearing.  HENT:     Right Ear: Tympanic membrane and ear canal normal.     Left Ear: There is impacted cerumen.  Cardiovascular:     Rate and Rhythm: Normal rate and regular rhythm.     Heart sounds: Normal heart sounds.  Pulmonary:     Effort: Pulmonary effort is normal.     Breath sounds: Normal breath sounds.  Neurological:     General: No focal deficit present.     Mental Status: She is alert and oriented to person, place, and time.  Mental status is at baseline.     Cranial Nerves: No cranial nerve deficit.     Sensory: No sensory deficit.     Motor: No weakness.     Coordination: Coordination normal.     Gait: Gait normal.           Assessment & Plan:  Benign paroxysmal positional vertigo, unspecified laterality  Begin meclizine 25 mg every 8 hours as needed vertigo.  Anticipate symptoms will gradually improve on their own over the next 7 to 10 days.  Discussed Epley maneuvers but at the present time we will monitor this conservatively and see if symptoms do not abate on their own as the patient is already starting to feel better.

## 2019-01-25 ENCOUNTER — Other Ambulatory Visit: Payer: Self-pay | Admitting: Obstetrics and Gynecology

## 2019-01-25 DIAGNOSIS — Z1231 Encounter for screening mammogram for malignant neoplasm of breast: Secondary | ICD-10-CM

## 2019-02-20 ENCOUNTER — Other Ambulatory Visit: Payer: Self-pay

## 2019-02-20 ENCOUNTER — Ambulatory Visit: Payer: BC Managed Care – PPO | Admitting: Family Medicine

## 2019-02-20 VITALS — BP 132/84 | HR 82 | Temp 97.4°F | Ht 64.0 in | Wt 123.0 lb

## 2019-02-20 DIAGNOSIS — H6021 Malignant otitis externa, right ear: Secondary | ICD-10-CM | POA: Diagnosis not present

## 2019-02-20 MED ORDER — NEOMYCIN-POLYMYXIN-HC 3.5-10000-1 OT SOLN: 4.0000 [drp] | Freq: Four times a day (QID) | OTIC | 1 refills | Status: DC

## 2019-02-20 NOTE — Progress Notes (Signed)
Subjective:    Patient ID: Stacy Day, female    DOB: 1955/07/28, 63 y.o.   MRN: [id number]  HPI  Patient presents with pain in the right auditory canal.  The pain has been present ever since Thanksgiving.  She describes it as a dull aching pain.  There is also some irritation and redness on the inside portion of the tragus.  There is some mild tenderness to palpation in that area.  She denies any tinnitus.  She denies any hearing loss.  She denies any sore throat.  She denies any sinus pain.  She denies any fevers or chills.  She denies any pain opening and closing her mouth although she does have a history of TMJ. No past medical history on file. No past surgical history on file. Current Outpatient Medications on File Prior to Visit  Medication Sig Dispense Refill  . ALPRAZolam (XANAX) 0.5 MG tablet alprazolam 0.5 mg tablet  TAKE 1 TABLET BY MOUTH ONCE DAILY AS NEEDED FOR 15 DAYS    . Ascorbic Acid (VITAMIN C) 1000 MG tablet Take 1,000 mg by mouth daily.    . Cholecalciferol (VITAMIN D PO) Take 1 tablet by mouth daily.    . cromolyn (OPTICROM) 4 % ophthalmic solution Place 1 drop into both eyes 4 (four) times daily. 10 mL 12  . Flaxseed, Linseed, (FLAX SEED OIL PO) Take 1 tablet by mouth daily.    Marland Kitchen loratadine (CLARITIN) 10 MG tablet Take 10 mg by mouth daily.    . meclizine (ANTIVERT) 25 MG tablet Take 1 tablet (25 mg total) by mouth 3 (three) times daily as needed for dizziness. 30 tablet 0  . zinc gluconate 50 MG tablet Take 50 mg by mouth daily.     No current facility-administered medications on file prior to visit.    No Known Allergies Social History   Socioeconomic History  . Marital status: Single    Spouse name: Not on file  . Number of children: Not on file  . Years of education: Not on file  . Highest education level: Not on file  Occupational History  . Not on file  Social Needs  . Financial resource strain: Not on file  . Food insecurity    Worry: Not on file     Inability: Not on file  . Transportation needs    Medical: Not on file    Non-medical: Not on file  Tobacco Use  . Smoking status: Never Smoker  . Smokeless tobacco: Never Used  Substance and Sexual Activity  . Alcohol use: Never    Frequency: Never  . Drug use: Never  . Sexual activity: Not on file  Lifestyle  . Physical activity    Days per week: Not on file    Minutes per session: Not on file  . Stress: Not on file  Relationships  . Social Herbalist on phone: Not on file    Gets together: Not on file    Attends religious service: Not on file    Active member of club or organization: Not on file    Attends meetings of clubs or organizations: Not on file    Relationship status: Not on file  . Intimate partner violence    Fear of current or ex partner: Not on file    Emotionally abused: Not on file    Physically abused: Not on file    Forced sexual activity: Not on file  Other Topics Concern  .  Not on file  Social History Narrative  . Not on file     Review of Systems  All other systems reviewed and are negative.      Objective:   Physical Exam Constitutional:      General: She is not in acute distress.    Appearance: Normal appearance. She is normal weight. She is not ill-appearing or toxic-appearing.  HENT:     Right Ear: Tympanic membrane normal. There is no impacted cerumen.     Left Ear: Tympanic membrane and ear canal normal. There is impacted cerumen.     Nose: Nose normal. No congestion or rhinorrhea.     Mouth/Throat:     Mouth: Mucous membranes are moist.     Pharynx: No oropharyngeal exudate or posterior oropharyngeal erythema.  Cardiovascular:     Rate and Rhythm: Normal rate and regular rhythm.     Heart sounds: Normal heart sounds.  Pulmonary:     Effort: Pulmonary effort is normal.  Neurological:     Mental Status: She is alert.           Assessment & Plan:  Acute malignant otitis externa of right ear  Exam today is  unremarkable aside from some redness at the auditory meatus.  There is also some tenderness as I inserted the speculum although there is no significant visible redness or swelling or exudate.  However based on her history, I believe she is dealing with otitis externa.  I will treat this with Cortisporin HC otic 4 drops 4 times daily for 10 days.  Consider TMJ if pain worsens

## 2019-03-20 ENCOUNTER — Other Ambulatory Visit: Payer: Self-pay

## 2019-03-20 ENCOUNTER — Ambulatory Visit
Admission: RE | Admit: 2019-03-20 | Discharge: 2019-03-20 | Disposition: A | Discharge status: Home / Self Care | Payer: BC Managed Care – PPO | Source: Ambulatory Visit | Attending: Obstetrics and Gynecology | Admitting: Obstetrics and Gynecology

## 2019-03-20 DIAGNOSIS — Z1231 Encounter for screening mammogram for malignant neoplasm of breast: Secondary | ICD-10-CM

## 2019-05-26 ENCOUNTER — Ambulatory Visit: Payer: BC Managed Care – PPO | Attending: Internal Medicine

## 2019-05-26 DIAGNOSIS — Z23 Encounter for immunization: Secondary | ICD-10-CM

## 2019-05-26 NOTE — Progress Notes (Signed)
   Covid-19 Vaccination Clinic  Name:  Tatijana Luebbers    MRN: [id number] DOB: 1955-07-07  05/26/2019  Ms. Saintvil was observed post Covid-19 immunization for 15 minutes without incident. She was provided with Vaccine Information Sheet and instruction to access the V-Safe system.   Ms. Kaltz was instructed to call 911 with any severe reactions post vaccine: Marland Kitchen Difficulty breathing  . Swelling of face and throat  . A fast heartbeat  . A bad rash all over body  . Dizziness and weakness

## 2019-06-21 ENCOUNTER — Ambulatory Visit: Payer: BC Managed Care – PPO | Attending: Internal Medicine

## 2019-06-21 DIAGNOSIS — Z23 Encounter for immunization: Secondary | ICD-10-CM

## 2019-06-21 NOTE — Progress Notes (Signed)
   Covid-19 Vaccination Clinic  Name:  Stacy Day    MRN: [id number] DOB: 07/21/1955  06/21/2019  [name] was observed post Covid-19 immunization for 15 minutes without incident. She was provided with Vaccine Information Sheet and instruction to access the V-Safe system.   [name] was instructed to call 911 with any severe reactions post vaccine: Marland Kitchen Difficulty breathing  . Swelling of face and throat  . A fast heartbeat  . A bad rash all over body  . Dizziness and weakness   Immunizations Administered    Name Date Dose VIS Date Route   Pfizer COVID-19 Vaccine 06/21/2019  1:20 PM 0.3 mL 03/03/2019 Intramuscular   Manufacturer: [place]   Lot: [id number]   [place]: [id number]

## 2019-09-27 DIAGNOSIS — D225 Melanocytic nevi of trunk: Secondary | ICD-10-CM | POA: Diagnosis not present

## 2019-09-27 DIAGNOSIS — L814 Other melanin hyperpigmentation: Secondary | ICD-10-CM | POA: Diagnosis not present

## 2019-09-27 DIAGNOSIS — L578 Other skin changes due to chronic exposure to nonionizing radiation: Secondary | ICD-10-CM | POA: Diagnosis not present

## 2019-09-27 DIAGNOSIS — L821 Other seborrheic keratosis: Secondary | ICD-10-CM | POA: Diagnosis not present

## 2019-11-14 DIAGNOSIS — B373 Candidiasis of vulva and vagina: Secondary | ICD-10-CM | POA: Diagnosis not present

## 2019-11-14 DIAGNOSIS — F411 Generalized anxiety disorder: Secondary | ICD-10-CM | POA: Diagnosis not present

## 2019-11-14 DIAGNOSIS — Z01419 Encounter for gynecological examination (general) (routine) without abnormal findings: Secondary | ICD-10-CM | POA: Diagnosis not present

## 2019-11-14 DIAGNOSIS — Z6820 Body mass index (BMI) 20.0-20.9, adult: Secondary | ICD-10-CM | POA: Diagnosis not present

## 2019-11-16 DIAGNOSIS — E559 Vitamin D deficiency, unspecified: Secondary | ICD-10-CM | POA: Diagnosis not present

## 2019-11-16 DIAGNOSIS — Z0000 Encounter for general adult medical examination without abnormal findings: Secondary | ICD-10-CM | POA: Diagnosis not present

## 2020-01-04 ENCOUNTER — Ambulatory Visit: Payer: Self-pay | Attending: Internal Medicine

## 2020-01-04 DIAGNOSIS — Z23 Encounter for immunization: Secondary | ICD-10-CM

## 2020-01-04 NOTE — Progress Notes (Signed)
   Covid-19 Vaccination Clinic  Name:  Stacy Day    MRN: [id number] DOB: 26-Apr-1955  01/04/2020  [name] was observed post Covid-19 immunization for 15 minutes without incident. She was provided with Vaccine Information Sheet and instruction to access the V-Safe system.   [name] was instructed to call 911 with any severe reactions post vaccine: Marland Kitchen Difficulty breathing  . Swelling of face and throat  . A fast heartbeat  . A bad rash all over body  . Dizziness and weakness

## 2020-01-12 DIAGNOSIS — H40013 Open angle with borderline findings, low risk, bilateral: Secondary | ICD-10-CM | POA: Diagnosis not present

## 2020-01-12 DIAGNOSIS — H2511 Age-related nuclear cataract, right eye: Secondary | ICD-10-CM | POA: Diagnosis not present

## 2020-01-12 DIAGNOSIS — H5201 Hypermetropia, right eye: Secondary | ICD-10-CM | POA: Diagnosis not present

## 2020-01-25 ENCOUNTER — Ambulatory Visit (INDEPENDENT_AMBULATORY_CARE_PROVIDER_SITE_OTHER): Payer: PPO | Admitting: *Deleted

## 2020-01-25 ENCOUNTER — Other Ambulatory Visit: Payer: Self-pay

## 2020-01-25 DIAGNOSIS — Z23 Encounter for immunization: Secondary | ICD-10-CM | POA: Diagnosis not present

## 2020-02-07 ENCOUNTER — Other Ambulatory Visit: Payer: Self-pay | Admitting: Obstetrics and Gynecology

## 2020-02-07 DIAGNOSIS — Z1231 Encounter for screening mammogram for malignant neoplasm of breast: Secondary | ICD-10-CM

## 2020-03-20 ENCOUNTER — Other Ambulatory Visit: Payer: Self-pay

## 2020-03-20 ENCOUNTER — Ambulatory Visit
Admission: RE | Admit: 2020-03-20 | Discharge: 2020-03-20 | Disposition: A | Discharge status: Home / Self Care | Payer: PPO | Source: Ambulatory Visit | Attending: Obstetrics and Gynecology | Admitting: Obstetrics and Gynecology

## 2020-03-20 DIAGNOSIS — Z1231 Encounter for screening mammogram for malignant neoplasm of breast: Secondary | ICD-10-CM | POA: Diagnosis not present

## 2020-05-04 IMAGING — CT CT MAXILLOFACIAL W/ CM
1 series · 15 of 30 positions shown, 19 images · IV contrast (APPLIED)
Comparison: None

CLINICAL DATA: Fall on 07/25/2017, hitting the left side of the
head. Knot under the left eye.

Creatinine was obtained on site at [HOSPITAL] at [HOSPITAL].
Results: Creatinine 0.8 mg/dL.
EXAM:
CT MAXILLOFACIAL WITH CONTRAST
TECHNIQUE: Multidetector CT imaging of the maxillofacial structures was
performed with intravenous contrast. Multiplanar CT image
reconstructions were also generated.
CONTRAST:  75mL 0PDJW2-TKK IOPAMIDOL (0PDJW2-TKK) INJECTION 61%

[Series 3: maxofacial -soft · axial · 0.32mm/px · z∈[-488,-281]mm · 15 of 75 slices shown, 19 images]
[im 3/75  brain]
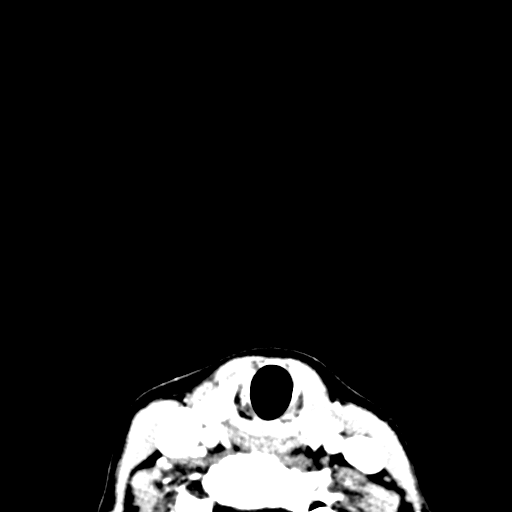
[im 3/75  bone]
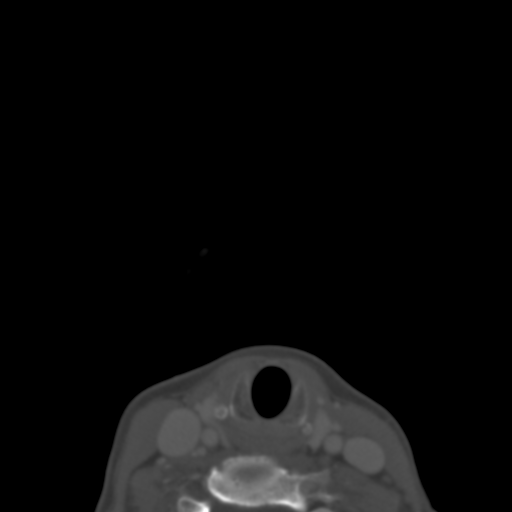
[im 8/75  bone]
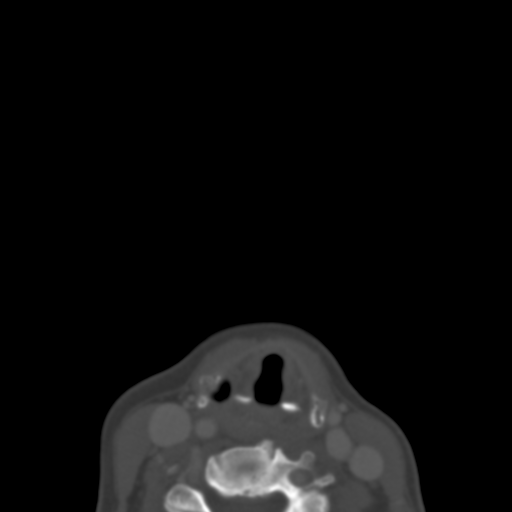
[im 13/75  bone]
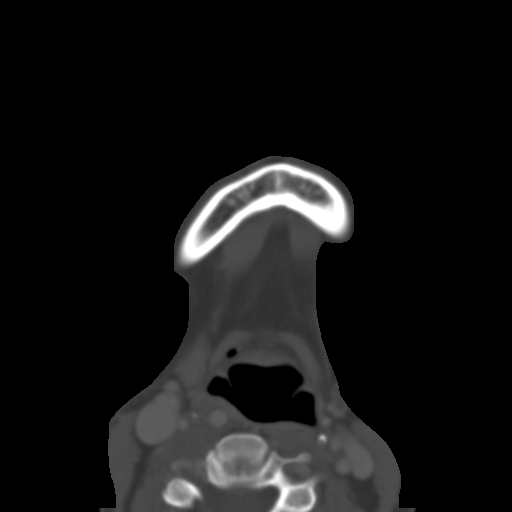
[im 18/75  bone]
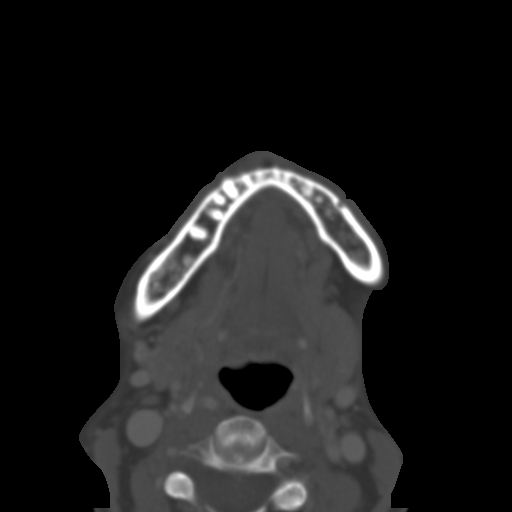
[im 23/75  brain]
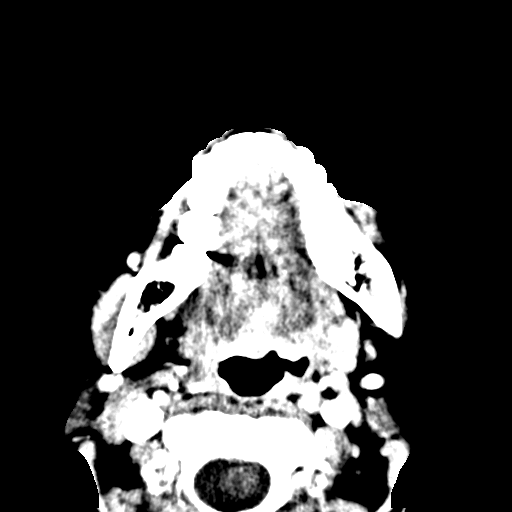
[im 23/75  bone]
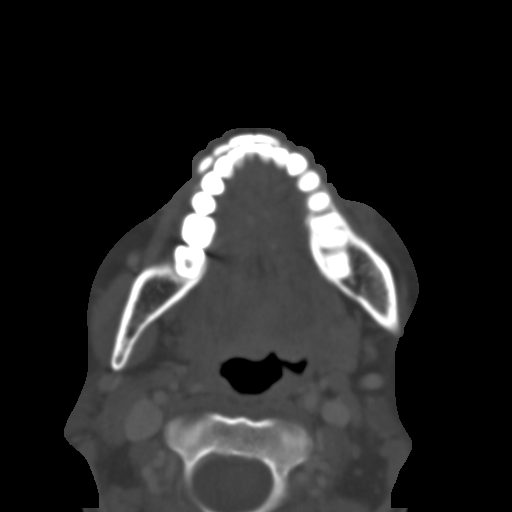
[im 29/75  bone]
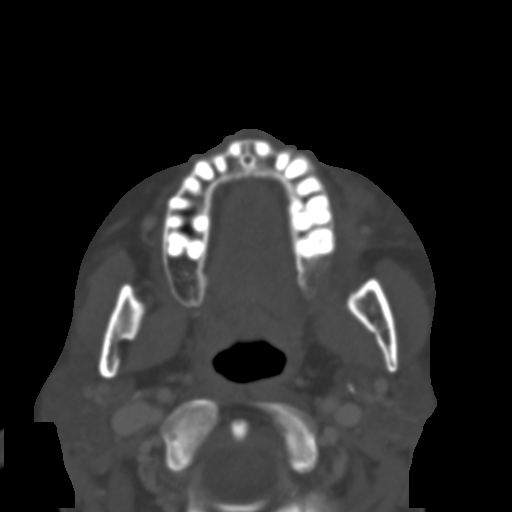
[im 34/75  bone]
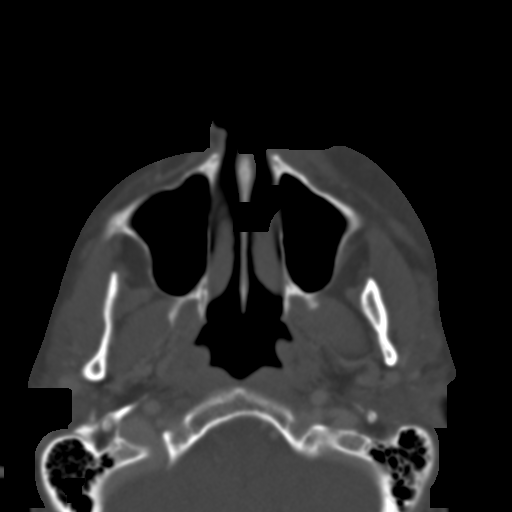
[im 39/75  bone]
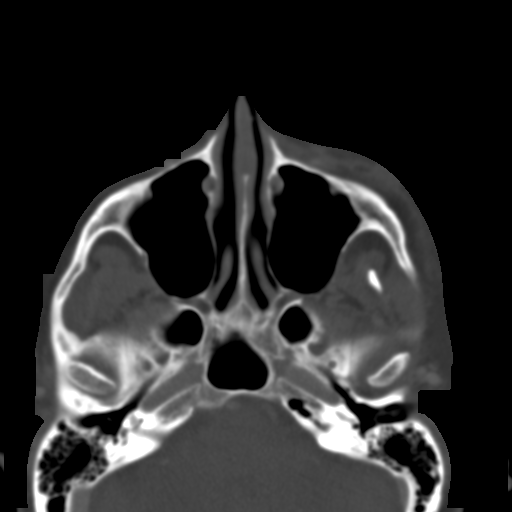
[im 41/75  brain]
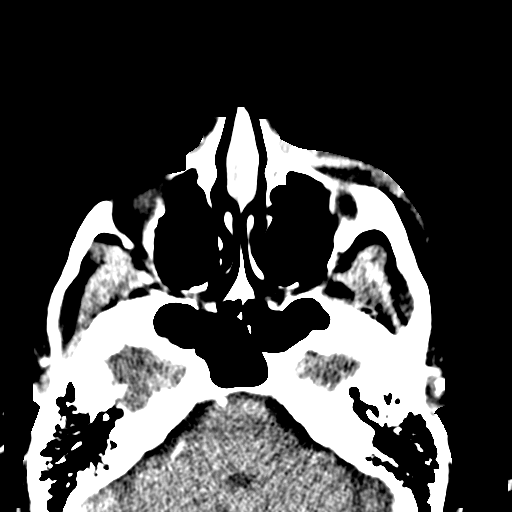
[im 41/75  bone]
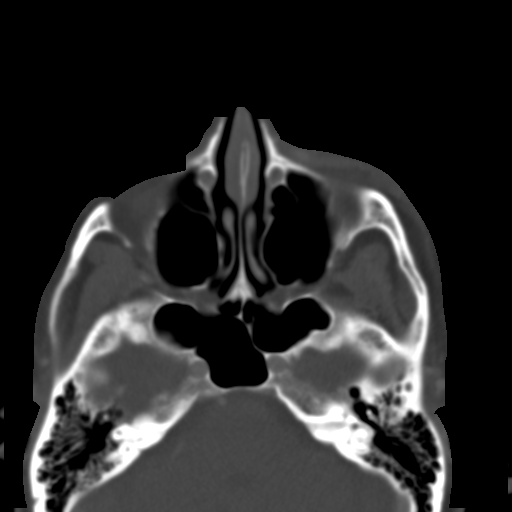
[im 46/75  bone]
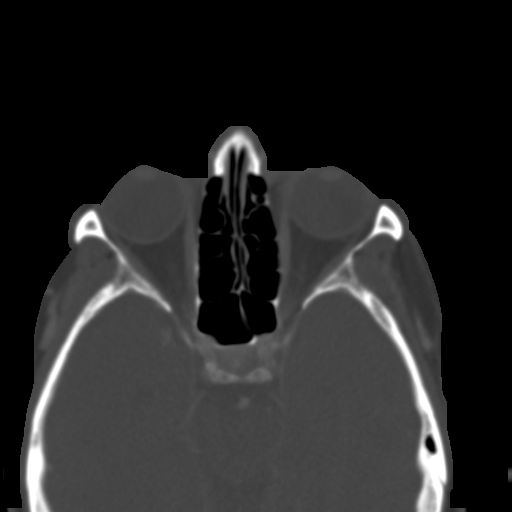
[im 52/75  bone]
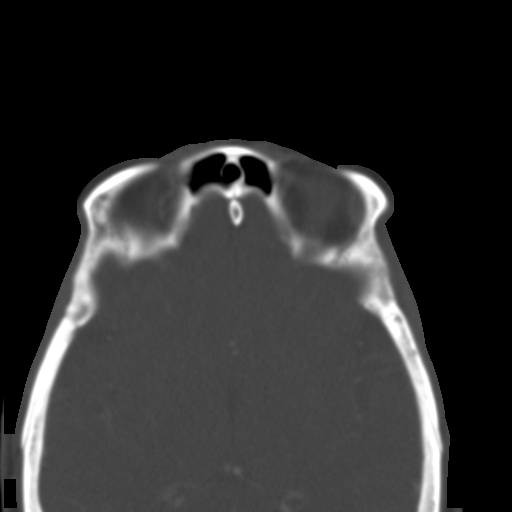
[im 57/75  bone]
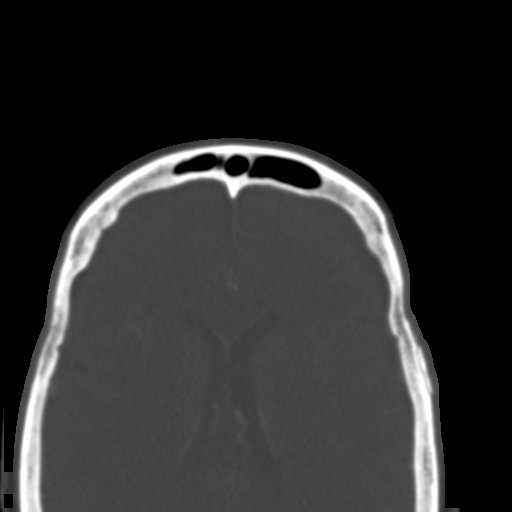
[im 62/75  brain]
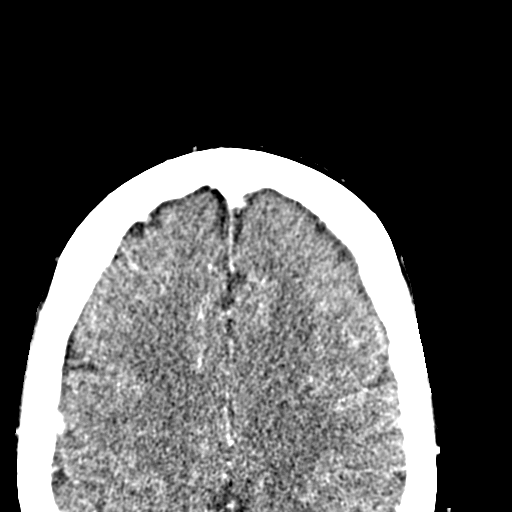
[im 62/75  bone]
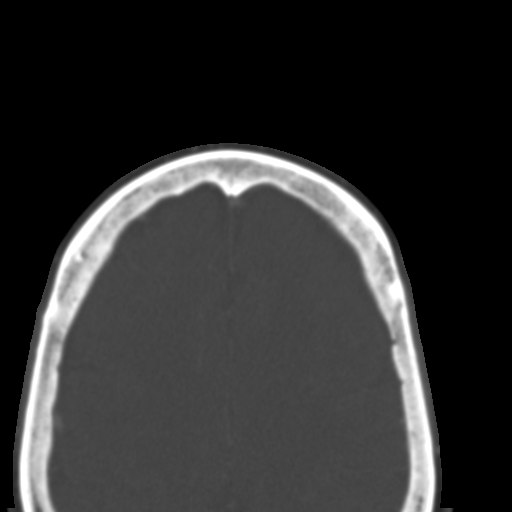
[im 67/75  bone]
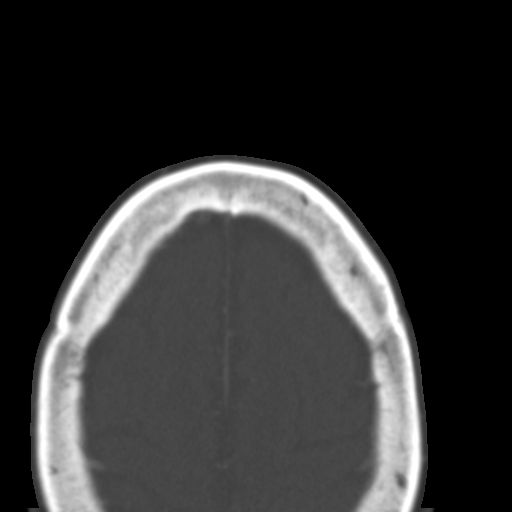
[im 72/75  bone]
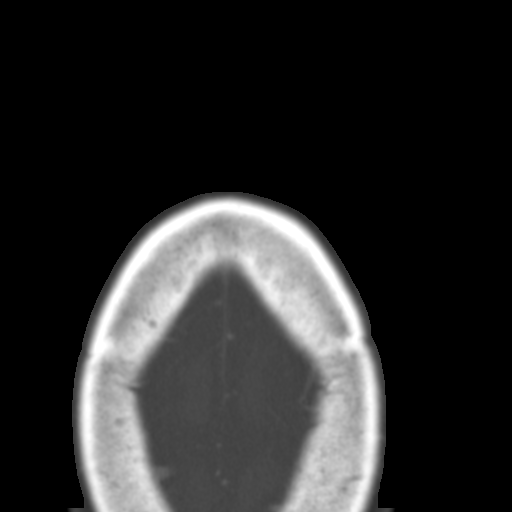

[15 of 30 positions shown; findings below may reference images not displayed]

FINDINGS: Osseous: No fracture or destructive osseous process. No mandibular
dislocation. Moderate disc space narrowing at C4-5 and C5-6 with
degenerative uncovertebral spurring.

Orbits: Globes appear intact. No retrobulbar hematoma or
inflammatory changes.

Sinuses: Paranasal sinuses and mastoid air cells are clear.

Soft tissues: Mild soft tissue swelling throughout the left face
extending from the lower eyelid through the cheek region. 1.4 cm
soft tissue density nodule in the left premalar region likely
represents a small hematoma.

Limited intracranial: Unremarkable.
IMPRESSION: Left-sided facial soft tissue swelling and small hematoma without
fracture.

## 2020-07-08 ENCOUNTER — Encounter: Payer: Self-pay | Admitting: Family Medicine

## 2020-07-08 ENCOUNTER — Ambulatory Visit (INDEPENDENT_AMBULATORY_CARE_PROVIDER_SITE_OTHER): Payer: Medicare Other | Admitting: Family Medicine

## 2020-07-08 ENCOUNTER — Other Ambulatory Visit: Payer: Self-pay

## 2020-07-08 VITALS — BP 124/70 | HR 84 | Temp 97.9°F | Resp 14 | Ht 64.0 in | Wt 120.0 lb

## 2020-07-08 DIAGNOSIS — H6123 Impacted cerumen, bilateral: Secondary | ICD-10-CM

## 2020-07-08 DIAGNOSIS — M5431 Sciatica, right side: Secondary | ICD-10-CM

## 2020-07-08 MED ORDER — PREDNISONE 20 MG PO TABS: ORAL_TABLET | ORAL | 0 refills | Status: DC

## 2020-07-08 NOTE — Progress Notes (Signed)
Subjective:    Patient ID: Stacy Day, female    DOB: Oct 23, 1955, 65 y.o.   MRN: [id number]  HPI Patient reports bilateral hearing loss.  She believes that she has wax plugs in both ears.  She would like to have the wax removed.  She also reports low back pain radiating down her right leg.  The pain radiates into her right gluteus and down her posterior right thigh and into her right calf.  It began after she started exercising at a gym recently.  She denies any bowel or bladder incontinence or saddle anesthesia.  She is been taking ibuprofen with minimal relief. History reviewed. No pertinent past medical history. History reviewed. No pertinent surgical history. Current Outpatient Medications on File Prior to Visit  Medication Sig Dispense Refill  . ALPRAZolam (XANAX) 0.5 MG tablet alprazolam 0.5 mg tablet  TAKE 1 TABLET BY MOUTH ONCE DAILY AS NEEDED FOR 15 DAYS    . Ascorbic Acid (VITAMIN C) 1000 MG tablet Take 1,000 mg by mouth daily.    . Cholecalciferol (VITAMIN D PO) Take 1 tablet by mouth daily.    . Flaxseed, Linseed, (FLAX SEED OIL PO) Take 1 tablet by mouth daily.    Marland Kitchen loratadine (CLARITIN) 10 MG tablet Take 10 mg by mouth daily.    Marland Kitchen zinc gluconate 50 MG tablet Take 50 mg by mouth daily.     No current facility-administered medications on file prior to visit.   No Known Allergies Social History   Socioeconomic History  . Marital status: Single    Spouse name: Not on file  . Number of children: Not on file  . Years of education: Not on file  . Highest education level: Not on file  Occupational History  . Not on file  Tobacco Use  . Smoking status: Never Smoker  . Smokeless tobacco: Never Used  Substance and Sexual Activity  . Alcohol use: Never  . Drug use: Never  . Sexual activity: Not on file  Other Topics Concern  . Not on file  Social History Narrative  . Not on file   Social Determinants of Health   Financial Resource Strain: Not on file  Food  Insecurity: Not on file  Transportation Needs: Not on file  Physical Activity: Not on file  Stress: Not on file  Social Connections: Not on file  Intimate Partner Violence: Not on file      Review of Systems  All other systems reviewed and are negative.      Objective:   Physical Exam Vitals reviewed.  Constitutional:      General: She is not in acute distress.    Appearance: Normal appearance. She is not ill-appearing or toxic-appearing.  HENT:     Right Ear: There is impacted cerumen.     Left Ear: There is impacted cerumen.  Cardiovascular:     Rate and Rhythm: Normal rate and regular rhythm.     Heart sounds: Normal heart sounds.  Pulmonary:     Effort: Pulmonary effort is normal.     Breath sounds: Normal breath sounds.  Musculoskeletal:     Lumbar back: No spasms or tenderness. Negative right straight leg raise test and negative left straight leg raise test.       Back:  Neurological:     General: No focal deficit present.     Mental Status: She is alert and oriented to person, place, and time. Mental status is at baseline.  Cranial Nerves: No cranial nerve deficit.     Sensory: No sensory deficit.     Motor: No weakness.     Coordination: Coordination normal.     Gait: Gait normal.           Assessment & Plan:  Bilateral impacted cerumen  Right sided sciatica  Patient cerumen impaction was removed with irrigation and lavage bilaterally.  I will treat her right-sided sciatica with a prednisone taper pack.  Recheck in 1 week if no better or sooner if worse.  At that time and I will obtain an x-ray of the lumbar spine however she prefers to defer that at the present time and wants to try the prednisone first.

## 2020-07-13 DIAGNOSIS — Z20822 Contact with and (suspected) exposure to covid-19: Secondary | ICD-10-CM | POA: Diagnosis not present

## 2020-08-21 ENCOUNTER — Other Ambulatory Visit (HOSPITAL_BASED_OUTPATIENT_CLINIC_OR_DEPARTMENT_OTHER): Payer: Self-pay

## 2020-10-03 DIAGNOSIS — D1801 Hemangioma of skin and subcutaneous tissue: Secondary | ICD-10-CM | POA: Diagnosis not present

## 2020-10-03 DIAGNOSIS — L820 Inflamed seborrheic keratosis: Secondary | ICD-10-CM | POA: Diagnosis not present

## 2020-10-03 DIAGNOSIS — L821 Other seborrheic keratosis: Secondary | ICD-10-CM | POA: Diagnosis not present

## 2020-12-01 NOTE — Progress Notes (Signed)
Subjective:    Patient ID: Stacy Day, female    DOB: [birth year], 65 y.o.   MRN: [id number]  HPI: Stacy Day is a 65 y.o. female presenting for swollen lymph node in groin.  Chief Complaint  Patient presents with   Follow-up    Check node in goin area on left side , notice about 1 week ago , having some pain ,    SWOLLEN LYMPH NODE Onset: 1 week ago Location: left groin Duration: 1.5 week ago, sudden Pain: yes Severity: daytime - 5-6/10, moderate at night Characteristics: dull, annoying Timing: worse at night Aggravating Factors: night time Relieving Factors: Advil, heat Treatments attempted:  heat, Advil Relief with treatments attempted: Advil and heat pack Meaning to patient: does not know Associated signs & symptoms: no skin changes, no fevers, rash  She had "infected lymph node" ~40 years ago in same area.  No Known Allergies  Outpatient Encounter Medications as of 12/02/2020  Medication Sig   ALPRAZolam (XANAX) 0.5 MG tablet alprazolam 0.5 mg tablet  TAKE 1 TABLET BY MOUTH ONCE DAILY AS NEEDED FOR 15 DAYS   Cholecalciferol (VITAMIN D PO) Take 1 tablet by mouth daily.   Flaxseed, Linseed, (FLAX SEED OIL PO) Take 1 tablet by mouth daily.   loratadine (CLARITIN) 10 MG tablet Take 10 mg by mouth daily.   zinc gluconate 50 MG tablet Take 50 mg by mouth daily.   [DISCONTINUED] Ascorbic Acid (VITAMIN C) 1000 MG tablet Take 1,000 mg by mouth daily.   [DISCONTINUED] predniSONE (DELTASONE) 20 MG tablet 3 tabs poqday 1-2, 2 tabs poqday 3-4, 1 tab poqday 5-6   No facility-administered encounter medications on file as of 12/02/2020.    Patient Active Problem List   Diagnosis Date Noted   Anxiety 12/16/2017   Blood in urine 12/16/2017   Pain of breast 12/16/2017   Pruritus of vagina 12/16/2017    No past medical history on file.  Relevant past medical, surgical, family and social history reviewed and updated as indicated. Interim medical history since  our last visit reviewed.  Review of Systems  Constitutional: Negative.  Negative for activity change, appetite change, chills, diaphoresis, fatigue and fever.  Eyes: Negative.  Negative for visual disturbance.  Cardiovascular: Negative.   Genitourinary:  Negative for decreased urine volume, dysuria, frequency, hematuria, pelvic pain, vaginal bleeding, vaginal discharge and vaginal pain.  Neurological: Negative.  Negative for facial asymmetry, light-headedness and headaches.  Hematological:  Positive for adenopathy.  Per HPI unless specifically indicated above     Objective:    BP 110/68 (BP Location: Left Arm, Patient Position: Sitting, Cuff Size: Normal)   Pulse 85   Ht '5\' 4"'$  (1.626 m)   Wt 114 lb 6.4 oz (51.9 kg)   SpO2 98%   BMI 19.64 kg/m   Wt Readings from Last 3 Encounters:  12/02/20 114 lb 6.4 oz (51.9 kg)  07/08/20 120 lb (54.4 kg)  02/20/19 123 lb (55.8 kg)    Physical Exam Vitals and nursing note reviewed. Exam conducted with a chaperone present (TV).  Constitutional:      General: She is not in acute distress.    Appearance: Normal appearance. She is not ill-appearing, toxic-appearing or diaphoretic.  HENT:     Head: Normocephalic and atraumatic.  Abdominal:     General: Abdomen is flat.     Palpations: Abdomen is soft.       Comments: Approximately 1 cm palpable, firm, mass to left inguinal canal.  Slightly tender to palpation, no skin changes, redness, swelling.  Genitourinary:    General: Normal vulva.     Exam position: Lithotomy position.     Pubic Area: No rash.      Labia:        Right: No rash, tenderness or lesion.        Left: No rash, tenderness or lesion.   Musculoskeletal:        General: Normal range of motion.     Right lower leg: No edema.     Left lower leg: No edema.  Skin:    General: Skin is warm and dry.     Capillary Refill: Capillary refill takes less than 2 seconds.     Coloration: Skin is not jaundiced or pale.     Findings: No  erythema or rash.  Neurological:     Mental Status: She is alert and oriented to person, place, and time.     Motor: No weakness.     Gait: Gait normal.  Psychiatric:        Mood and Affect: Mood normal.        Behavior: Behavior normal.        Thought Content: Thought content normal.        Judgment: Judgment normal.      Assessment & Plan:  1. Mass of left inguinal region Acute.  Unclear etiology.  Examination today with palpable mass, not sure if this is lymph node definitively.  no red flag or B symptoms today.  Wet prep negative for yeast infection, BV, trichomonas.  Has not been sexually active in years-declines STI screening.  Will check CBC to check white blood cell count.  Also check urinalysis for acute infection.  Lastly, we will check a pelvic ultrasound to see if this is a swollen lymph node and if so, consider ultrasound-guided biopsy.  Follow-up pending results or sooner if symptoms worsen or if change.  - WET PREP FOR TRICH, YEAST, CLUE - CBC with Differential - US Pelvis Complete; Future - Urine Culture; Future - Urinalysis, Routine w reflex microscopic; Future   Follow up plan: No follow-ups on file.

## 2020-12-02 ENCOUNTER — Other Ambulatory Visit: Payer: Self-pay

## 2020-12-02 ENCOUNTER — Ambulatory Visit (INDEPENDENT_AMBULATORY_CARE_PROVIDER_SITE_OTHER): Payer: Medicare Other | Admitting: Nurse Practitioner

## 2020-12-02 VITALS — BP 110/68 | HR 85 | Ht 64.0 in | Wt 114.4 lb

## 2020-12-02 DIAGNOSIS — K4090 Unilateral inguinal hernia, without obstruction or gangrene, not specified as recurrent: Secondary | ICD-10-CM

## 2020-12-02 DIAGNOSIS — R590 Localized enlarged lymph nodes: Secondary | ICD-10-CM | POA: Diagnosis not present

## 2020-12-02 DIAGNOSIS — R1909 Other intra-abdominal and pelvic swelling, mass and lump: Secondary | ICD-10-CM

## 2020-12-02 LAB — CBC WITH DIFFERENTIAL/PLATELET
Absolute Monocytes: 616 cells/uL (ref 200–950)
Basophils Absolute: 47 cells/uL (ref 0–200)
Basophils Relative: 0.6 %
Eosinophils Absolute: 150 cells/uL (ref 15–500)
Eosinophils Relative: 1.9 %
HCT: 40.5 % (ref 35.0–45.0)
Hemoglobin: 12.7 g/dL (ref 11.7–15.5)
Lymphs Abs: 2536 cells/uL (ref 850–3900)
MCH: 29.1 pg (ref 27.0–33.0)
MCHC: 31.4 g/dL — ABNORMAL LOW (ref 32.0–36.0)
MCV: 92.9 fL (ref 80.0–100.0)
MPV: 9.9 fL (ref 7.5–12.5)
Monocytes Relative: 7.8 %
Neutro Abs: 4550 cells/uL (ref 1500–7800)
Neutrophils Relative %: 57.6 %
Platelets: 275 10*3/uL (ref 140–400)
RBC: 4.36 10*6/uL (ref 3.80–5.10)
RDW: 11.9 % (ref 11.0–15.0)
Total Lymphocyte: 32.1 %
WBC: 7.9 10*3/uL (ref 3.8–10.8)

## 2020-12-02 LAB — WET PREP FOR TRICH, YEAST, CLUE

## 2020-12-03 ENCOUNTER — Encounter: Payer: Self-pay | Admitting: Nurse Practitioner

## 2020-12-06 IMAGING — MG DIGITAL SCREENING BILATERAL MAMMOGRAM WITH TOMO AND CAD
3 series · 3 of 11 positions shown · non-contrast
Comparison: Previous exam(s).

CLINICAL DATA: Screening.

EXAM:
DIGITAL SCREENING BILATERAL MAMMOGRAM WITH TOMO AND CAD

[R CC synth-2D]
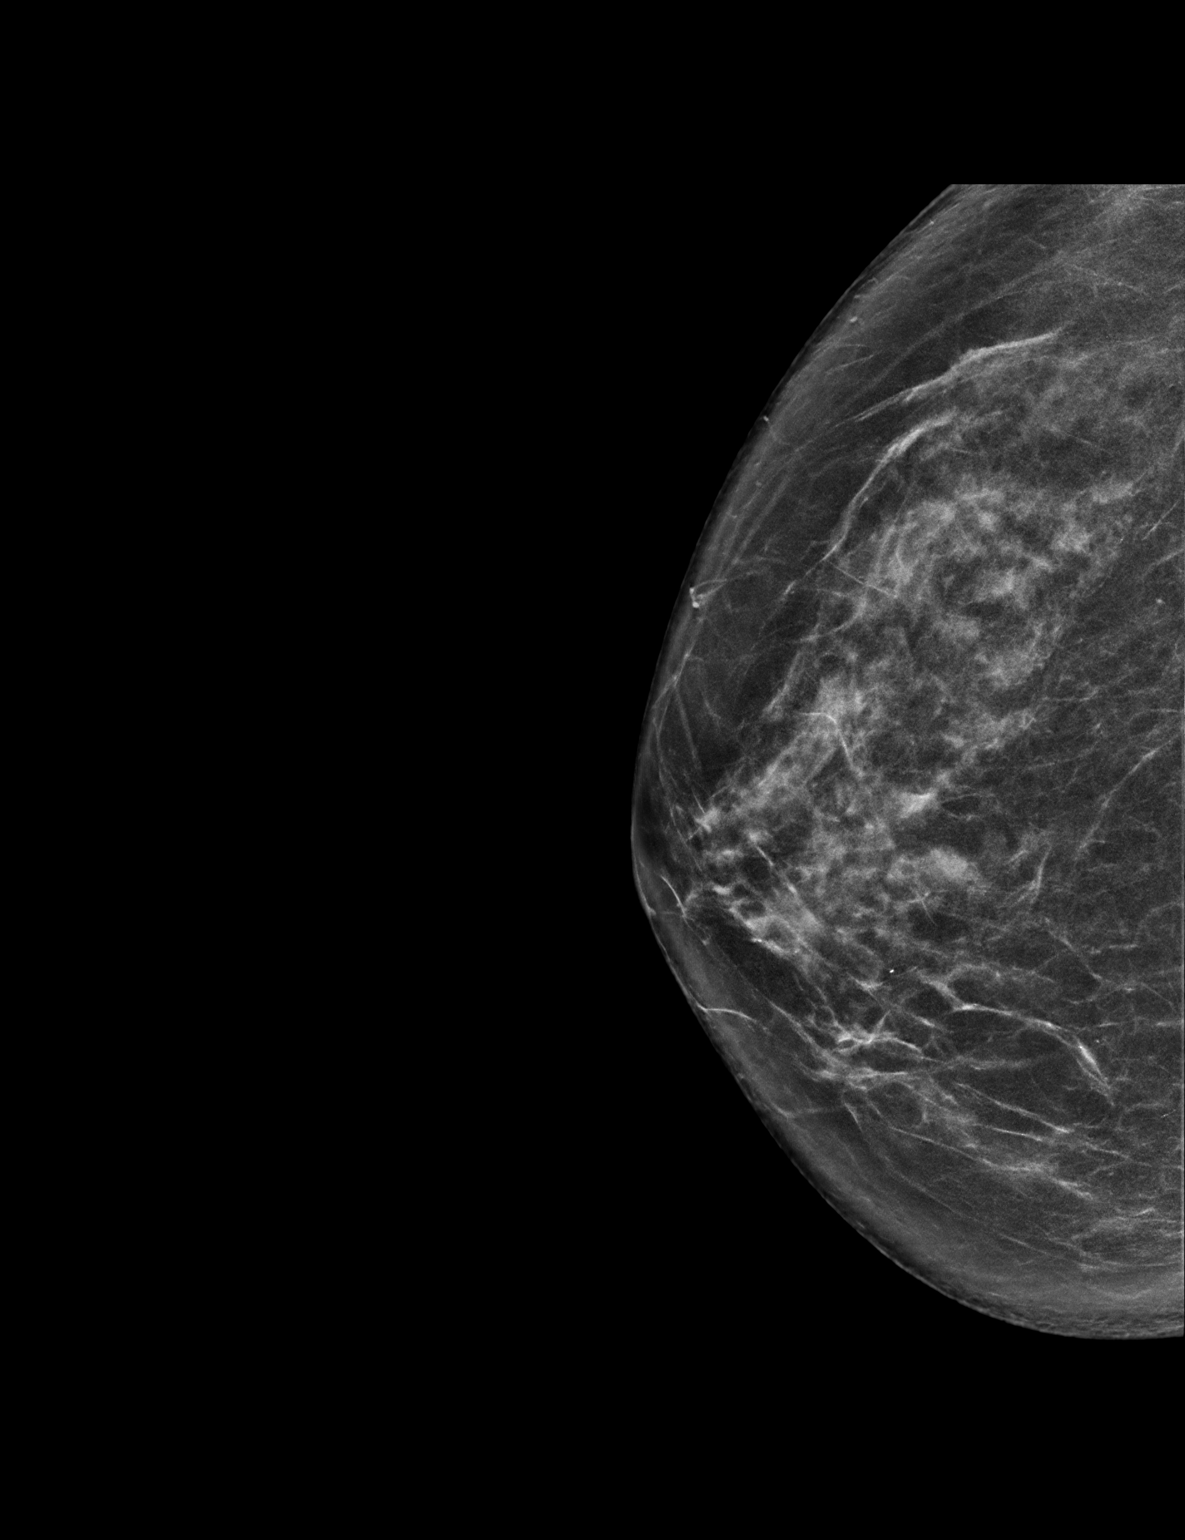

[L CC tomo · tomo slice 33/64.0]
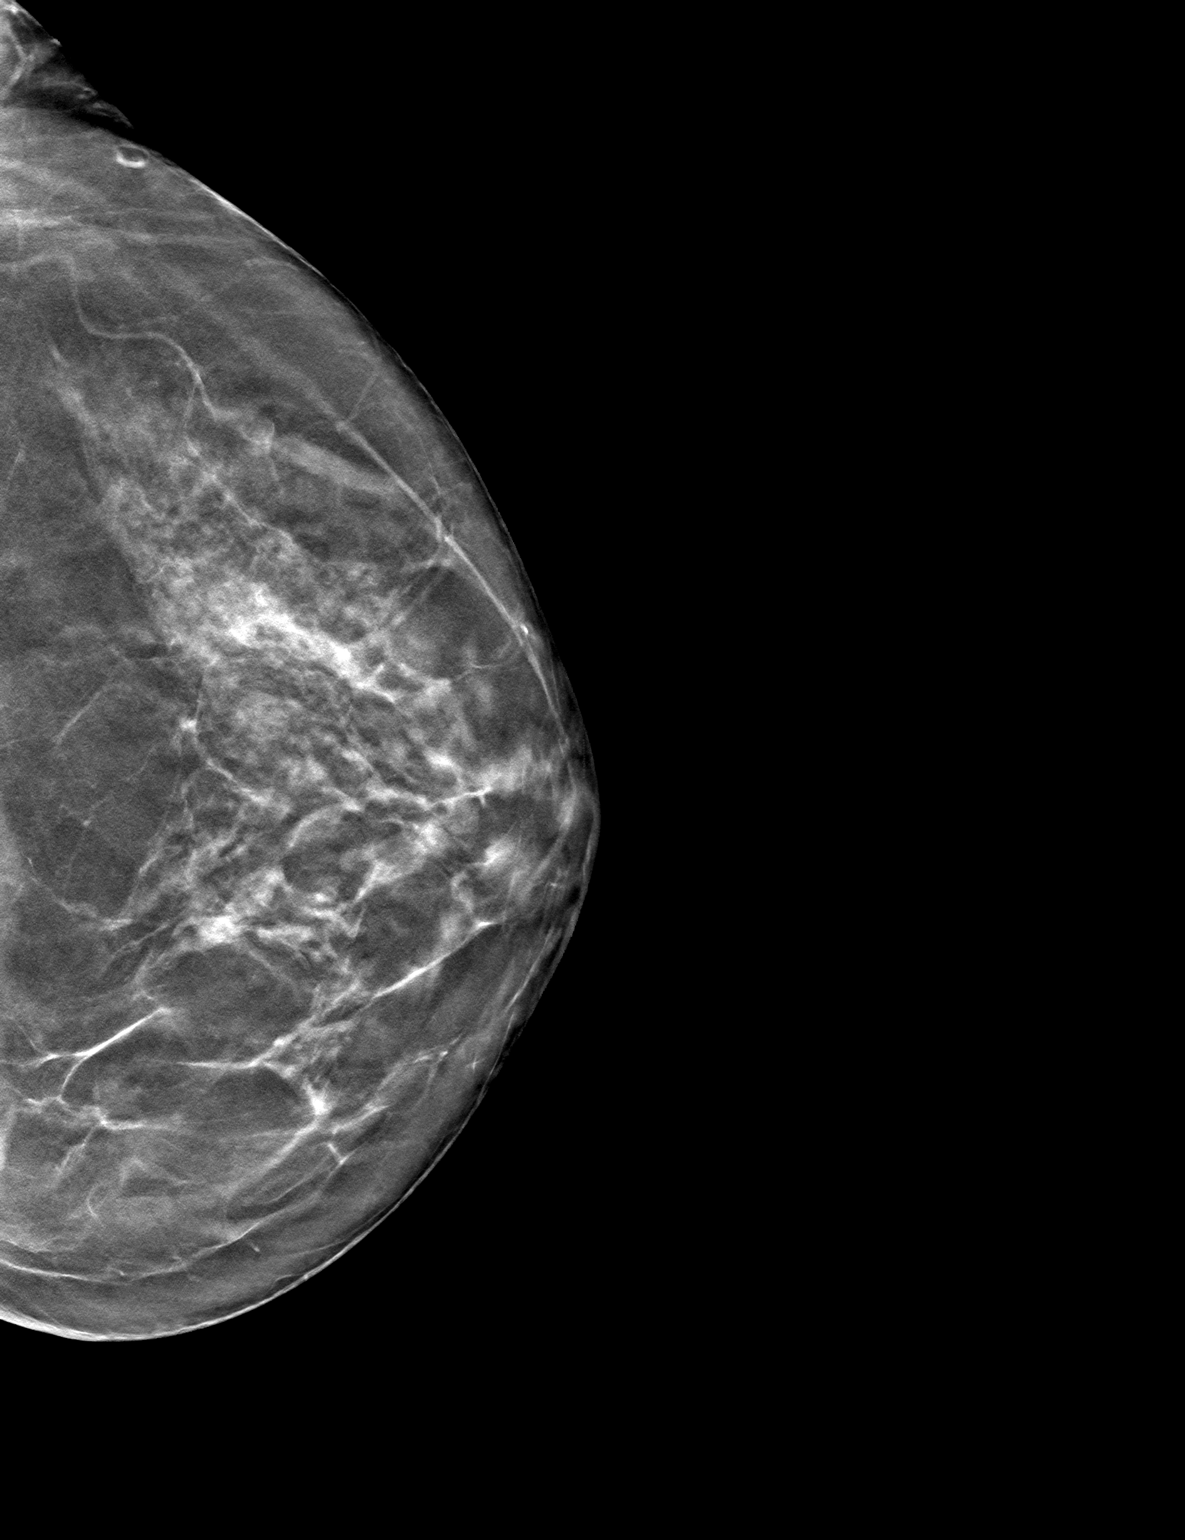

[R MLO tomo · tomo slice 34/67.0]
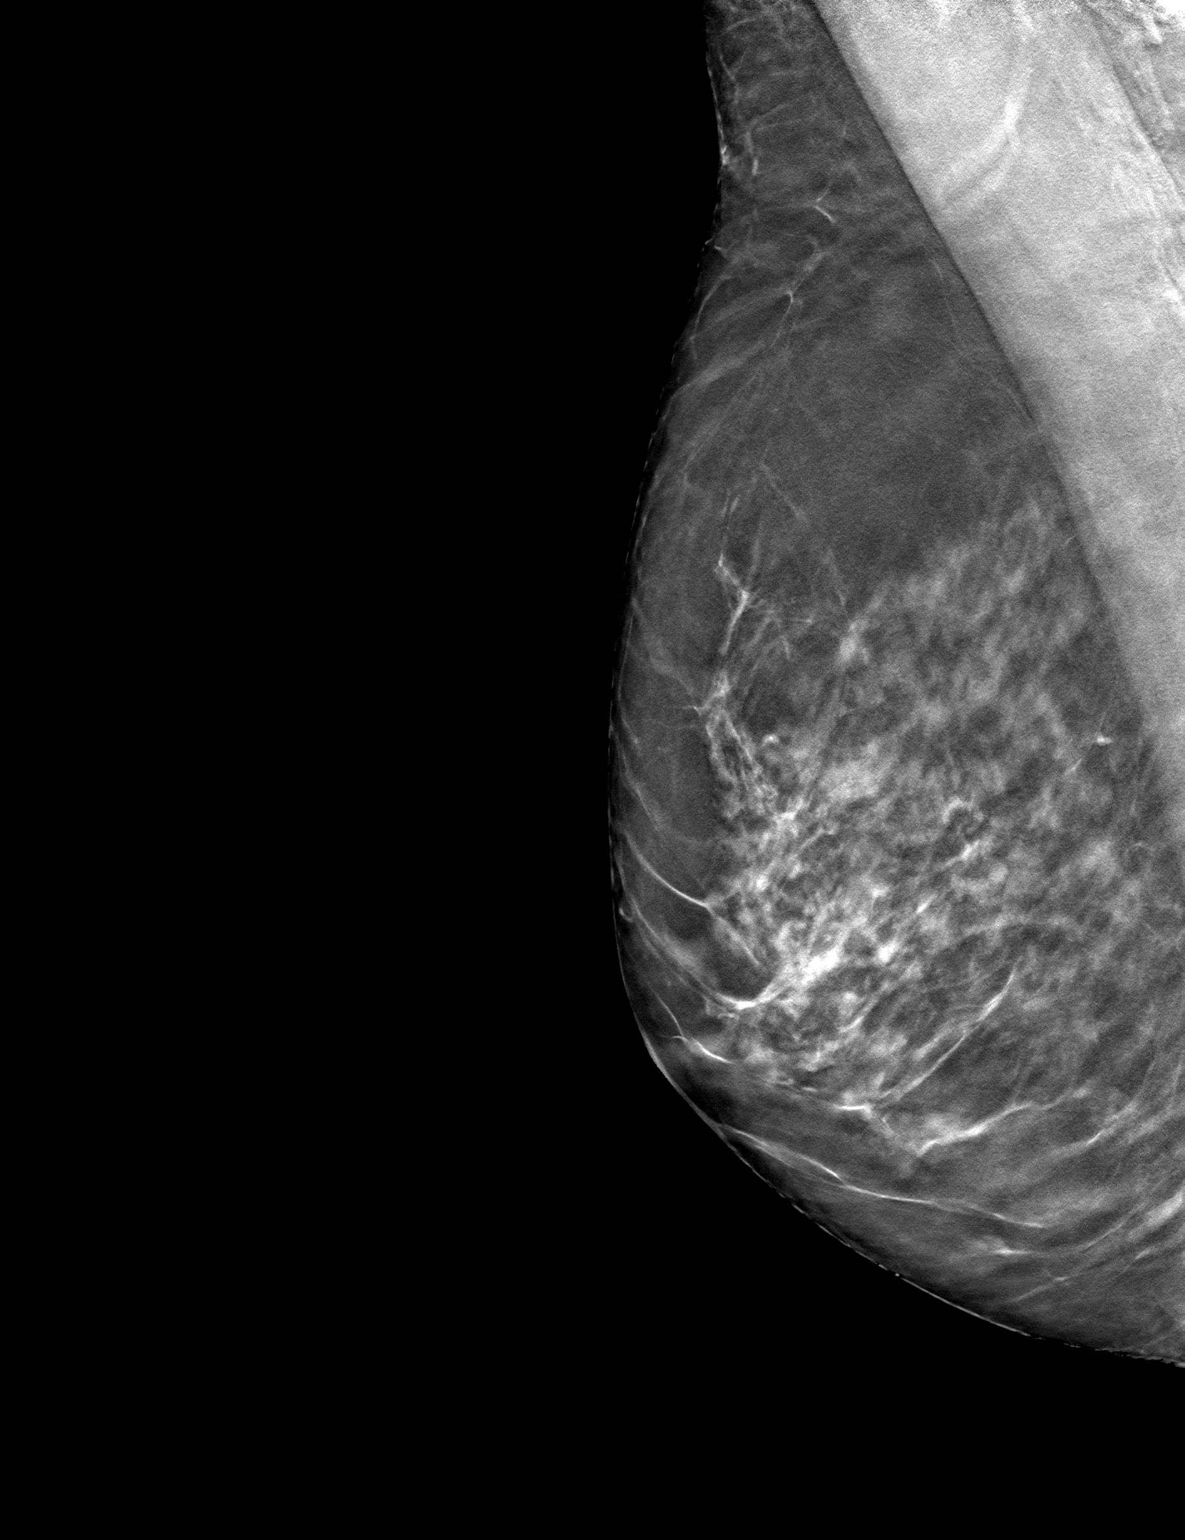

[3 of 11 positions shown; findings below may reference images not displayed]

ACR Breast Density Category b: There are scattered areas of
fibroglandular density.
FINDINGS: There are no findings suspicious for malignancy. Images were
processed with CAD.
IMPRESSION: No mammographic evidence of malignancy. A result letter of this
screening mammogram will be mailed directly to the patient.

RECOMMENDATION:
Screening mammogram in one year. (Code:[id number])

BI-RADS CATEGORY  1: Negative.

## 2020-12-09 ENCOUNTER — Ambulatory Visit
Admission: RE | Admit: 2020-12-09 | Discharge: 2020-12-09 | Disposition: A | Discharge status: Home / Self Care | Payer: Medicare Other | Source: Ambulatory Visit | Attending: Nurse Practitioner | Admitting: Nurse Practitioner

## 2020-12-09 DIAGNOSIS — R1909 Other intra-abdominal and pelvic swelling, mass and lump: Secondary | ICD-10-CM

## 2020-12-10 NOTE — Addendum Note (Signed)
Addended by: Noemi Chapel A on: 12/10/2020 05:11 PM   Modules accepted: Orders

## 2020-12-11 ENCOUNTER — Other Ambulatory Visit (HOSPITAL_BASED_OUTPATIENT_CLINIC_OR_DEPARTMENT_OTHER): Payer: Self-pay

## 2020-12-11 MED ORDER — PREVNAR 20 0.5 ML IM SUSY
PREFILLED_SYRINGE | INTRAMUSCULAR | 0 refills | Status: DC
  Filled 2020-12-11: qty 0.5, 1d supply, fill #0

## 2020-12-13 ENCOUNTER — Other Ambulatory Visit: Payer: Self-pay

## 2020-12-13 ENCOUNTER — Ambulatory Visit (INDEPENDENT_AMBULATORY_CARE_PROVIDER_SITE_OTHER): Payer: Medicare Other | Admitting: Family Medicine

## 2020-12-13 VITALS — BP 122/60 | HR 74 | Temp 98.8°F | Resp 16 | Ht 64.0 in | Wt 115.0 lb

## 2020-12-13 DIAGNOSIS — K4090 Unilateral inguinal hernia, without obstruction or gangrene, not specified as recurrent: Secondary | ICD-10-CM

## 2020-12-13 MED ORDER — IBUPROFEN 800 MG PO TABS: 800.0000 mg | ORAL_TABLET | Freq: Three times a day (TID) | ORAL | 0 refills | Status: AC | PRN

## 2020-12-13 NOTE — Progress Notes (Signed)
Subjective:    Patient ID: Stacy Day, female    DOB: 17-May-1955, 65 y.o.   MRN: [id number]  HPI Patient is a very healthy 65 year old Caucasian female who was recently seen by my partner for a bulge in the left inguinal canal.  Initial concern was for possible lymphoma so an ultrasound was obtained to evaluate for lymphadenopathy.  However the ultrasound confirmed left inguinal hernia.  The patient continues to have tenderness in the left inguinal canal with a bulge that waxes and wanes throughout the day depending upon position.  She would like a referral to general surgery to discuss repair No past medical history on file. No past surgical history on file. Current Outpatient Medications on File Prior to Visit  Medication Sig Dispense Refill   ALPRAZolam (XANAX) 0.5 MG tablet alprazolam 0.5 mg tablet  TAKE 1 TABLET BY MOUTH ONCE DAILY AS NEEDED FOR 15 DAYS     Cholecalciferol (VITAMIN D PO) Take 1 tablet by mouth daily.     Flaxseed, Linseed, (FLAX SEED OIL PO) Take 1 tablet by mouth daily.     loratadine (CLARITIN) 10 MG tablet Take 10 mg by mouth daily.     pneumococcal 20-Val Conj Vacc (PREVNAR 20) 0.5 ML injection Inject into the muscle. 0.5 mL 0   zinc gluconate 50 MG tablet Take 50 mg by mouth daily.     No current facility-administered medications on file prior to visit.   No Known Allergies Social History   Socioeconomic History   Marital status: Single    Spouse name: Not on file   Number of children: Not on file   Years of education: Not on file   Highest education level: Not on file  Occupational History   Not on file  Tobacco Use   Smoking status: Never   Smokeless tobacco: Never  Substance and Sexual Activity   Alcohol use: Never   Drug use: Never   Sexual activity: Not on file  Other Topics Concern   Not on file  Social History Narrative   Not on file   Social Determinants of Health   Financial Resource Strain: Not on file  Food Insecurity: Not  on file  Transportation Needs: Not on file  Physical Activity: Not on file  Stress: Not on file  Social Connections: Not on file  Intimate Partner Violence: Not on file      Review of Systems  All other systems reviewed and are negative.     Objective:   Physical Exam Vitals reviewed.  Constitutional:      General: She is not in acute distress.    Appearance: Normal appearance. She is not ill-appearing or toxic-appearing.  Cardiovascular:     Rate and Rhythm: Normal rate and regular rhythm.     Heart sounds: Normal heart sounds.  Pulmonary:     Effort: Pulmonary effort is normal.     Breath sounds: Normal breath sounds.  Abdominal:     Hernia: A hernia is present. Hernia is present in the left inguinal area.    Neurological:     General: No focal deficit present.     Mental Status: She is alert and oriented to person, place, and time. Mental status is at baseline.     Cranial Nerves: No cranial nerve deficit.     Sensory: No sensory deficit.     Motor: No weakness.     Coordination: Coordination normal.     Gait: Gait normal.  Assessment & Plan:  Left inguinal hernia - Plan: Ambulatory referral to General Surgery I will consult general surgery.  Recommended ibuprofen 800 mg every 8 hours as needed for pain

## 2021-01-07 ENCOUNTER — Other Ambulatory Visit (HOSPITAL_BASED_OUTPATIENT_CLINIC_OR_DEPARTMENT_OTHER): Payer: Self-pay

## 2021-01-07 MED ORDER — INFLUENZA VAC A&B SA ADJ QUAD 0.5 ML IM PRSY
PREFILLED_SYRINGE | INTRAMUSCULAR | 0 refills | Status: DC
  Filled 2021-01-07: qty 0.5, 1d supply, fill #0

## 2021-01-23 DIAGNOSIS — B3731 Acute candidiasis of vulva and vagina: Secondary | ICD-10-CM | POA: Diagnosis not present

## 2021-01-27 ENCOUNTER — Ambulatory Visit: Payer: Medicare Other | Attending: Internal Medicine

## 2021-01-27 ENCOUNTER — Other Ambulatory Visit: Payer: Self-pay

## 2021-01-27 ENCOUNTER — Other Ambulatory Visit (HOSPITAL_BASED_OUTPATIENT_CLINIC_OR_DEPARTMENT_OTHER): Payer: Self-pay

## 2021-01-27 DIAGNOSIS — Z23 Encounter for immunization: Secondary | ICD-10-CM

## 2021-01-27 MED ORDER — PFIZER COVID-19 VAC BIVALENT 30 MCG/0.3ML IM SUSP
INTRAMUSCULAR | 0 refills | Status: DC
  Filled 2021-01-27: qty 0.3, 1d supply, fill #0

## 2021-01-27 NOTE — Progress Notes (Signed)
   Covid-19 Vaccination Clinic  Name:  Stacy Day    MRN: [id number] DOB: 1955/10/05  01/27/2021  [name] was observed post Covid-19 immunization for 15 minutes without incident. She was provided with Vaccine Information Sheet and instruction to access the V-Safe system.   [name] was instructed to call 911 with any severe reactions post vaccine: Difficulty breathing  Swelling of face and throat  A fast heartbeat  A bad rash all over body  Dizziness and weakness   Immunizations Administered     Name Date Dose VIS Date Route   Pfizer Covid-19 Vaccine Bivalent Booster 01/27/2021 10:15 AM 0.3 mL 11/20/2020 Intramuscular   Manufacturer: [place]   Lot: [id number]   [place]: [phone]

## 2021-02-04 ENCOUNTER — Other Ambulatory Visit: Payer: Self-pay | Admitting: Obstetrics and Gynecology

## 2021-02-04 DIAGNOSIS — Z1231 Encounter for screening mammogram for malignant neoplasm of breast: Secondary | ICD-10-CM

## 2021-02-20 DIAGNOSIS — H2511 Age-related nuclear cataract, right eye: Secondary | ICD-10-CM | POA: Diagnosis not present

## 2021-02-20 DIAGNOSIS — H52223 Regular astigmatism, bilateral: Secondary | ICD-10-CM | POA: Diagnosis not present

## 2021-02-20 DIAGNOSIS — H524 Presbyopia: Secondary | ICD-10-CM | POA: Diagnosis not present

## 2021-02-20 DIAGNOSIS — H5212 Myopia, left eye: Secondary | ICD-10-CM | POA: Diagnosis not present

## 2021-02-20 DIAGNOSIS — H5201 Hypermetropia, right eye: Secondary | ICD-10-CM | POA: Diagnosis not present

## 2021-02-21 ENCOUNTER — Other Ambulatory Visit: Payer: Self-pay | Admitting: Surgery

## 2021-02-21 DIAGNOSIS — K4090 Unilateral inguinal hernia, without obstruction or gangrene, not specified as recurrent: Secondary | ICD-10-CM | POA: Diagnosis not present

## 2021-03-18 ENCOUNTER — Other Ambulatory Visit: Payer: Self-pay

## 2021-03-18 ENCOUNTER — Encounter: Payer: Self-pay | Admitting: Family Medicine

## 2021-03-18 ENCOUNTER — Ambulatory Visit (INDEPENDENT_AMBULATORY_CARE_PROVIDER_SITE_OTHER): Payer: Medicare Other | Admitting: Family Medicine

## 2021-03-18 VITALS — BP 112/62 | HR 90 | Temp 98.1°F | Resp 16 | Ht 64.0 in | Wt 117.8 lb

## 2021-03-18 DIAGNOSIS — J019 Acute sinusitis, unspecified: Secondary | ICD-10-CM

## 2021-03-18 MED ORDER — AMOXICILLIN 875 MG PO TABS: 875.0000 mg | ORAL_TABLET | Freq: Two times a day (BID) | ORAL | 0 refills | Status: AC

## 2021-03-18 NOTE — Progress Notes (Signed)
Subjective:    Patient ID: Stacy Day, female    DOB: May 29, 1955, 65 y.o.   MRN: [id number]  HPI Symptoms began about 5 days ago.  Symptoms include head congestion.  She has pain and pressure in both maxillary and both frontal sinuses.  She reports postnasal drip.  Both ears are stopped up and full of pressure.  She has a constant headache.  She is having postnasal drip causing a cough that is nonproductive.  She is blowing bloody and green mucus from both nostrils whenever she blows her nose.  Her teeth hurt.  She has done 2 COVID test that are negative. History reviewed. No pertinent past medical history. History reviewed. No pertinent surgical history. Current Outpatient Medications on File Prior to Visit  Medication Sig Dispense Refill   ALPRAZolam (XANAX) 0.5 MG tablet alprazolam 0.5 mg tablet  TAKE 1 TABLET BY MOUTH ONCE DAILY AS NEEDED FOR 15 DAYS     Cholecalciferol (VITAMIN D PO) Take 1 tablet by mouth daily.     COVID-19 mRNA bivalent vaccine, Pfizer, (PFIZER COVID-19 VAC BIVALENT) injection Inject into the muscle. 0.3 mL 0   Flaxseed, Linseed, (FLAXSEED OIL PO) Take by mouth.     ibuprofen (ADVIL) 800 MG tablet Take 1 tablet (800 mg total) by mouth every 8 (eight) hours as needed. 30 tablet 0   influenza vaccine adjuvanted (FLUAD) 0.5 ML injection Inject into the muscle. 0.5 mL 0   loratadine (CLARITIN) 10 MG tablet Take 10 mg by mouth daily.     Multiple Vitamin (MULTIVITAMIN ADULT PO) Daily Multi-Vitamin     pneumococcal 20-Val Conj Vacc (PREVNAR 20) 0.5 ML injection Inject into the muscle. 0.5 mL 0   zinc gluconate 50 MG tablet Take 50 mg by mouth daily.     No current facility-administered medications on file prior to visit.   No Known Allergies Social History   Socioeconomic History   Marital status: Single    Spouse name: Not on file   Number of children: Not on file   Years of education: Not on file   Highest education level: Not on file  Occupational  History   Not on file  Tobacco Use   Smoking status: Never   Smokeless tobacco: Never  Substance and Sexual Activity   Alcohol use: Never   Drug use: Never   Sexual activity: Not on file  Other Topics Concern   Not on file  Social History Narrative   Not on file   Social Determinants of Health   Financial Resource Strain: Not on file  Food Insecurity: Not on file  Transportation Needs: Not on file  Physical Activity: Not on file  Stress: Not on file  Social Connections: Not on file  Intimate Partner Violence: Not on file     Review of Systems  All other systems reviewed and are negative.     Objective:   Physical Exam Constitutional:      General: She is not in acute distress.    Appearance: Normal appearance. She is normal weight. She is not ill-appearing or toxic-appearing.  HENT:     Right Ear: Tympanic membrane and ear canal normal.     Left Ear: Tympanic membrane and ear canal normal.     Nose: Congestion and rhinorrhea present.     Right Turbinates: Enlarged and swollen.     Left Turbinates: Enlarged and swollen.     Right Sinus: Maxillary sinus tenderness and frontal sinus tenderness present.  Left Sinus: Maxillary sinus tenderness and frontal sinus tenderness present.     Mouth/Throat:     Mouth: Mucous membranes are moist.     Pharynx: No oropharyngeal exudate or posterior oropharyngeal erythema.  Cardiovascular:     Rate and Rhythm: Normal rate and regular rhythm.     Heart sounds: Normal heart sounds.  Pulmonary:     Effort: Pulmonary effort is normal.  Neurological:     Mental Status: She is alert.          Assessment & Plan:  Acute rhinosinusitis Patient has a sinus infection.  I believe this most likely viral as its only been 5 days.  I recommended Mucinex and Sudafed and tincture of time.  I did give her a prescription for amoxicillin with strict instructions not to fill unless symptoms last longer than 10 days or worsen.  Patient  understands and will try tincture of time with supportive care for the next 2 to 3 days.

## 2021-03-25 ENCOUNTER — Ambulatory Visit
Admission: RE | Admit: 2021-03-25 | Discharge: 2021-03-25 | Disposition: A | Discharge status: Home / Self Care | Payer: Medicare Other | Source: Ambulatory Visit | Attending: Obstetrics and Gynecology | Admitting: Obstetrics and Gynecology

## 2021-03-25 DIAGNOSIS — Z1231 Encounter for screening mammogram for malignant neoplasm of breast: Secondary | ICD-10-CM

## 2021-03-26 ENCOUNTER — Telehealth: Payer: Self-pay | Admitting: Family Medicine

## 2021-03-26 NOTE — Telephone Encounter (Signed)
Left message for patient to call back and schedule Medicare Annual Wellness Visit (AWV) in office.  ° °If not able to come in office, please offer to do virtually or by telephone.  Left office number and my jabber #[phone]. ° °Due for AWVI ° °Please schedule at anytime with Nurse Health Advisor. °  °

## 2021-03-31 ENCOUNTER — Ambulatory Visit (INDEPENDENT_AMBULATORY_CARE_PROVIDER_SITE_OTHER): Payer: Medicare Other | Admitting: Family Medicine

## 2021-03-31 ENCOUNTER — Other Ambulatory Visit: Payer: Self-pay

## 2021-03-31 ENCOUNTER — Encounter: Payer: Self-pay | Admitting: Family Medicine

## 2021-03-31 VITALS — BP 112/68 | HR 81 | Temp 97.1°F | Resp 18 | Ht 64.0 in | Wt 117.0 lb

## 2021-03-31 DIAGNOSIS — J019 Acute sinusitis, unspecified: Secondary | ICD-10-CM | POA: Diagnosis not present

## 2021-03-31 DIAGNOSIS — H6981 Other specified disorders of Eustachian tube, right ear: Secondary | ICD-10-CM | POA: Diagnosis not present

## 2021-03-31 MED ORDER — FLUCONAZOLE 150 MG PO TABS: 150.0000 mg | ORAL_TABLET | Freq: Once | ORAL | 0 refills | Status: AC

## 2021-03-31 MED ORDER — PREDNISONE 20 MG PO TABS: ORAL_TABLET | ORAL | 0 refills | Status: DC

## 2021-03-31 MED ORDER — AMOXICILLIN-POT CLAVULANATE 875-125 MG PO TABS: 1.0000 | ORAL_TABLET | Freq: Two times a day (BID) | ORAL | 0 refills | Status: DC

## 2021-03-31 NOTE — Progress Notes (Signed)
Subjective:    Patient ID: Stacy Day, female    DOB: 1955-11-12, 66 y.o.   MRN: [id number]  HPI 03/18/21 Symptoms began about 5 days ago.  Symptoms include head congestion.  She has pain and pressure in both maxillary and both frontal sinuses.  She reports postnasal drip.  Both ears are stopped up and full of pressure.  She has a constant headache.  She is having postnasal drip causing a cough that is nonproductive.  She is blowing bloody and green mucus from both nostrils whenever she blows her nose.  Her teeth hurt.  She has done 2 COVID test that are negative.  At that time, my plan was:  Patient has a sinus infection.  I believe this most likely viral as its only been 5 days.  I recommended Mucinex and Sudafed and tincture of time.  I did give her a prescription for amoxicillin with strict instructions not to fill unless symptoms last longer than 10 days or worsen.  Patient understands and will try tincture of time with supportive care for the next 2 to 3 days.  03/31/21 Patient continues to report pain and pressure in her left maxillary sinus.  She also states that it feels like her right ear walking quite.  She even tried Debrox over-the-counter.  However when I examined the patient's ear canal, there is no wax in her right auditory canal.  The tympanic membrane itself is gray in color.  There is no obvious effusion.  There is no erythema.  There is no exudate in the canal.  The patient reports rhinorrhea and sinus pressure.  She states that she feels like she is in an airplane.  Her head feels stopped up and she has a difficult time hearing out of her right ear. History reviewed. No pertinent past medical history. History reviewed. No pertinent surgical history. Current Outpatient Medications on File Prior to Visit  Medication Sig Dispense Refill   ALPRAZolam (XANAX) 0.5 MG tablet alprazolam 0.5 mg tablet  TAKE 1 TABLET BY MOUTH ONCE DAILY AS NEEDED FOR 15 DAYS     Cholecalciferol  (VITAMIN D PO) Take 1 tablet by mouth daily.     Flaxseed, Linseed, (FLAXSEED OIL PO) Take by mouth.     ibuprofen (ADVIL) 800 MG tablet Take 1 tablet (800 mg total) by mouth every 8 (eight) hours as needed. 30 tablet 0   loratadine (CLARITIN) 10 MG tablet Take 10 mg by mouth daily.     Multiple Vitamin (MULTIVITAMIN ADULT PO) Daily Multi-Vitamin     zinc gluconate 50 MG tablet Take 50 mg by mouth daily.     No current facility-administered medications on file prior to visit.   No Known Allergies Social History   Socioeconomic History   Marital status: Single    Spouse name: Not on file   Number of children: Not on file   Years of education: Not on file   Highest education level: Not on file  Occupational History   Not on file  Tobacco Use   Smoking status: Never   Smokeless tobacco: Never  Substance and Sexual Activity   Alcohol use: Never   Drug use: Never   Sexual activity: Not on file  Other Topics Concern   Not on file  Social History Narrative   Not on file   Social Determinants of Health   Financial Resource Strain: Not on file  Food Insecurity: Not on file  Transportation Needs: Not on file  Physical  Activity: Not on file  Stress: Not on file  Social Connections: Not on file  Intimate Partner Violence: Not on file     Review of Systems  All other systems reviewed and are negative.     Objective:   Physical Exam Constitutional:      General: She is not in acute distress.    Appearance: Normal appearance. She is normal weight. She is not ill-appearing or toxic-appearing.  HENT:     Right Ear: Tympanic membrane and ear canal normal.     Left Ear: Tympanic membrane and ear canal normal.     Nose: Mucosal edema, congestion and rhinorrhea present.     Right Turbinates: Enlarged and swollen.     Left Turbinates: Enlarged and swollen.     Left Sinus: Maxillary sinus tenderness present.     Mouth/Throat:     Mouth: Mucous membranes are moist.     Pharynx:  No oropharyngeal exudate or posterior oropharyngeal erythema.  Cardiovascular:     Rate and Rhythm: Normal rate and regular rhythm.     Heart sounds: Normal heart sounds.  Pulmonary:     Effort: Pulmonary effort is normal.  Neurological:     Mental Status: She is alert.          Assessment & Plan:  Acute rhinosinusitis  Eustachian tube dysfunction, right I believe the patient has persistent sinusitis and eustachian tube dysfunction.  She is already using Claritin and she is unable to tolerate Flonase.  Therefore I will step up to a prednisone taper pack in addition to the Claritin and add Augmentin 875 mg twice daily for 10 days from persistent sinusitis.  If not improving after this, I would recommend an ENT consultation due to eustachian tube dysfunction and refractory sinusitis

## 2021-04-25 ENCOUNTER — Telehealth: Payer: Self-pay | Admitting: Family Medicine

## 2021-04-25 NOTE — Telephone Encounter (Signed)
Left message for patient to call back and schedule Medicare Annual Wellness Visit (AWV) in office.  ° °If not able to come in office, please offer to do virtually or by telephone.  Left office number and my jabber #[phone]. ° °Due for AWVI ° °Please schedule at anytime with Nurse Health Advisor. °  °

## 2021-05-15 ENCOUNTER — Telehealth: Payer: Self-pay | Admitting: Family Medicine

## 2021-05-15 NOTE — Telephone Encounter (Signed)
Left message for patient to call back and schedule Medicare Annual Wellness Visit (AWV) in office.  ° °If not able to come in office, please offer to do virtually or by telephone.  Left office number and my jabber #[phone]. ° °Due for AWVI ° °Please schedule at anytime with Nurse Health Advisor. °  °

## 2021-05-22 DIAGNOSIS — G8918 Other acute postprocedural pain: Secondary | ICD-10-CM | POA: Diagnosis not present

## 2021-05-22 DIAGNOSIS — K4090 Unilateral inguinal hernia, without obstruction or gangrene, not specified as recurrent: Secondary | ICD-10-CM | POA: Diagnosis not present

## 2021-05-29 ENCOUNTER — Ambulatory Visit (INDEPENDENT_AMBULATORY_CARE_PROVIDER_SITE_OTHER): Payer: Medicare Other

## 2021-05-29 ENCOUNTER — Other Ambulatory Visit: Payer: Self-pay

## 2021-05-29 VITALS — Ht 64.0 in | Wt 117.0 lb

## 2021-05-29 DIAGNOSIS — L814 Other melanin hyperpigmentation: Secondary | ICD-10-CM | POA: Insufficient documentation

## 2021-05-29 DIAGNOSIS — Z808 Family history of malignant neoplasm of other organs or systems: Secondary | ICD-10-CM | POA: Insufficient documentation

## 2021-05-29 DIAGNOSIS — Z0000 Encounter for general adult medical examination without abnormal findings: Secondary | ICD-10-CM

## 2021-05-29 DIAGNOSIS — D225 Melanocytic nevi of trunk: Secondary | ICD-10-CM | POA: Insufficient documentation

## 2021-05-29 DIAGNOSIS — L821 Other seborrheic keratosis: Secondary | ICD-10-CM | POA: Insufficient documentation

## 2021-05-29 DIAGNOSIS — D1800 Hemangioma unspecified site: Secondary | ICD-10-CM | POA: Insufficient documentation

## 2021-05-29 NOTE — Progress Notes (Signed)
Subjective:   Stacy Day is a 66 y.o. female who presents for an Initial Medicare Annual Wellness Visit. Virtual Visit via Telephone Note  I connected with  Stacy Day on 05/29/21 at 11:15 AM EST by telephone and verified that I am speaking with the correct person using two identifiers.  Location: Patient: HOME Provider: [facility] Persons participating in the virtual visit: patient/Nurse Health Advisor   I discussed the limitations, risks, security and privacy concerns of performing an evaluation and management service by telephone and the availability of in person appointments. The patient expressed understanding and agreed to proceed.  Interactive audio and video telecommunications were attempted between this nurse and patient, however failed, due to patient having technical difficulties OR patient did not have access to video capability.  We continued and completed visit with audio only.  Some vital signs may be absent or patient reported.   Chriss Driver, LPN  Review of Systems     Cardiac Risk Factors include: advanced age (>51mn, >>76women);Other (see comment), Risk factor comments: Anxiety     Objective:    Today's Vitals   05/29/21 1119  Weight: 117 lb (53.1 kg)  Height: '5\' 4"'$  (1.626 m)   Body mass index is 20.08 kg/m.  Advanced Directives 05/29/2021  Does Patient Have a Medical Advance Directive? Yes  Type of AParamedicof APeggsLiving will  Copy of HLymanin Chart? No - copy requested    Current Medications (verified) Outpatient Encounter Medications as of 05/29/2021  Medication Sig   ALPRAZolam (XANAX) 0.5 MG tablet alprazolam 0.5 mg tablet  TAKE 1 TABLET BY MOUTH ONCE DAILY AS NEEDED FOR 15 DAYS   Cetirizine HCl (ZYRTEC ALLERGY) 10 MG CAPS    Cholecalciferol (VITAMIN D PO) Take 1 tablet by mouth daily.   Flaxseed, Linseed, (FLAXSEED OIL PO) Take by mouth.   ibuprofen (ADVIL) 800 MG tablet  Take 1 tablet (800 mg total) by mouth every 8 (eight) hours as needed.   loratadine (CLARITIN) 10 MG tablet Take 10 mg by mouth daily.   Multiple Vitamins-Minerals (PRESERVISION AREDS 2+MULTI VIT) CAPS    traMADol (ULTRAM) 50 MG tablet Take 50 mg by mouth every 6 (six) hours as needed.   Turmeric 450 MG CAPS    zinc gluconate 50 MG tablet Take 50 mg by mouth daily.   [DISCONTINUED] amoxicillin-clavulanate (AUGMENTIN) 875-125 MG tablet Take 1 tablet by mouth 2 (two) times daily.   [DISCONTINUED] Cholecalciferol (VITAMIN D) 125 MCG (5000 UT) CAPS    [DISCONTINUED] Multiple Vitamin (MULTIVITAMIN ADULT PO) Daily Multi-Vitamin   [DISCONTINUED] predniSONE (DELTASONE) 20 MG tablet 3 tabs poqday 1-2, 2 tabs poqday 3-4, 1 tab poqday 5-6   No facility-administered encounter medications on file as of 05/29/2021.    Allergies (verified) Patient has no known allergies.   History: History reviewed. No pertinent past medical history. History reviewed. No pertinent surgical history. Family History  Problem Relation Age of Onset   Breast cancer Paternal Aunt    Breast cancer Cousin    Social History   Socioeconomic History   Marital status: Single    Spouse name: Not on file   Number of children: Not on file   Years of education: Not on file   Highest education level: Not on file  Occupational History   Not on file  Tobacco Use   Smoking status: Never   Smokeless tobacco: Never  Substance and Sexual Activity   Alcohol use:  Never   Drug use: Never   Sexual activity: Not on file  Other Topics Concern   Not on file  Social History Narrative   Not on file   Social Determinants of Health   Financial Resource Strain: Low Risk    Difficulty of Paying Living Expenses: Not hard at all  Food Insecurity: No Food Insecurity   Worried About Running Out of Food in the Last Year: Never true   [place] in the Last Year: Never true  Transportation Needs: No Transportation Needs   Lack of  Transportation (Medical): No   Lack of Transportation (Non-Medical): No  Physical Activity: Sufficiently Active   Days of Exercise per Week: 5 days   Minutes of Exercise per Session: 30 min  Stress: No Stress Concern Present   Feeling of Stress : Not at all  Social Connections: Moderately Isolated   Frequency of Communication with Friends and Family: More than three times a week   Frequency of Social Gatherings with Friends and Family: More than three times a week   Attends Religious Services: Never   Marine scientist or Organizations: Yes   Attends Music therapist: More than 4 times per year   Marital Status: Never married    Tobacco Counseling Counseling given: Not Answered   Clinical Intake:  Pre-visit preparation completed: Yes  Pain : No/denies pain     BMI - recorded: 20.08 Nutritional Status: BMI of 19-24  Normal Nutritional Risks: None Diabetes: No  How often do you need to have someone help you when you read instructions, pamphlets, or other written materials from your doctor or pharmacy?: 1 - Never  Diabetic?NO  Interpreter Needed?: No  Information entered by :: mj Demontrez Rindfleisch, lpn   Activities of Daily Living In your present state of health, do you have any difficulty performing the following activities: 05/29/2021  Hearing? N  Vision? N  Difficulty concentrating or making decisions? N  Walking or climbing stairs? N  Dressing or bathing? N  Doing errands, shopping? N  Preparing Food and eating ? N  Using the Toilet? N  In the past six months, have you accidently leaked urine? N  Do you have problems with loss of bowel control? N  Managing your Medications? N  Managing your Finances? N  Housekeeping or managing your Housekeeping? N  Some recent data might be hidden    Patient Care Team: Susy Frizzle, MD as PCP - General (Family Medicine)  Indicate any recent Medical Services you may have received from other than [facility] providers  in the past year (date may be approximate).     Assessment:   This is a routine wellness examination for [place].  Hearing/Vision screen Hearing Screening - Comments:: No hearing issues.  Vision Screening - Comments:: Readers.   Dietary issues and exercise activities discussed: Current Exercise Habits: Home exercise routine, Type of exercise: walking, Time (Minutes): 30, Frequency (Times/Week): 5, Weekly Exercise (Minutes/Week): 150, Intensity: Mild, Exercise limited by: psychological condition(s)   Goals Addressed             This Visit's Progress    Exercise 3x per week (30 min per time)       Continue to exercise and stay healthy. Travel more.        Depression Screen PHQ 2/9 Scores 05/29/2021  PHQ - 2 Score 0    Fall Risk Fall Risk  05/29/2021  Falls in the past year? 0  Number falls  in past yr: 0  Injury with Fall? 0  Risk for fall due to : No Fall Risks  Follow up Falls prevention discussed    [place]:  Any stairs in or around the home? Yes  If so, are there any without handrails? No  Home free of loose throw rugs in walkways, pet beds, electrical cords, etc? Yes  Adequate lighting in your home to reduce risk of falls? Yes   ASSISTIVE DEVICES UTILIZED TO PREVENT FALLS:  Life alert? No  Use of a cane, walker or w/c? No  Grab bars in the bathroom? Yes  Shower chair or bench in shower? No  Elevated toilet seat or a handicapped toilet? Yes   TIMED UP AND GO:  Was the test performed? No .  Phone visit.   Cognitive Function: Normal cognitive status assessed by direct observation by this Nurse Health Advisor. No abnormalities found.  Pt declined 6CIT.       Immunizations Immunization History  Administered Date(s) Administered   Fluad Quad(high Dose 65+) 01/25/2020   Influenza,inj,Quad PF,6+ Mos 01/05/2013, 01/18/2014, 01/10/2015, 01/28/2016, 01/05/2017, 12/24/2017, 12/30/2018   Influenza-Unspecified 12/27/2008,  01/16/2010, 01/28/2011, 01/13/2012   PFIZER(Purple Top)SARS-COV-2 Vaccination 05/26/2019, 06/21/2019, 01/04/2020   PNEUMOCOCCAL CONJUGATE-20 12/11/2020   Pfizer Covid-19 Vaccine Bivalent Booster 62yr & up 01/27/2021   Tdap 09/28/2016   Zoster Recombinat (Shingrix) 10/20/2019, 12/21/2019    TDAP status: Up to date  Flu Vaccine status: Due, Education has been provided regarding the importance of this vaccine. Advised may receive this vaccine at local pharmacy or Health Dept. Aware to provide a copy of the vaccination record if obtained from local pharmacy or Health Dept. Verbalized acceptance and understanding.  Pneumococcal vaccine status: Up to date  Covid-19 vaccine status: Completed vaccines  Qualifies for Shingles Vaccine? Yes   Zostavax completed Yes   Shingrix Completed?: Yes  Screening Tests Health Maintenance  Topic Date Due   Hepatitis C Screening  Never done   INFLUENZA VACCINE  10/21/2020   DEXA SCAN  06/01/2022 (Originally 08/14/2019)   MAMMOGRAM  03/26/2023   TETANUS/TDAP  09/29/2026   COLONOSCOPY (Pts 45-467yrInsurance coverage will need to be confirmed)  10/13/2026   Pneumonia Vaccine 6524Years old  Completed   COVID-19 Vaccine  Completed   Zoster Vaccines- Shingrix  Completed   HPV VACCINES  Aged Out    Health Maintenance  Health Maintenance Due  Topic Date Due   Hepatitis C Screening  Never done   INFLUENZA VACCINE  10/21/2020    Colorectal cancer screening: Type of screening: Colonoscopy. Completed 10/12/2016. Repeat every 10 years  Mammogram status: Completed 03/25/2021. Repeat every year  Bone Density status: Ordered DISCUSSED WITH PATIENT. Pt provided with contact info and advised to call to schedule appt.  Lung Cancer Screening: (Low Dose CT Chest recommended if Age 67-80ears, 30 pack-year currently smoking OR have quit w/in 15years.) does not qualify.   Additional Screening:  Hepatitis C Screening: does qualify; Completed DUE  Vision  Screening: Recommended annual ophthalmology exams for early detection of glaucoma and other disorders of the eye. Is the patient up to date with their annual eye exam?  Yes  Who is the provider or what is the name of the office in which the patient attends annual eye exams? Pt unable to remember name of provider. If pt is not established with a provider, would they like to be referred to a provider to establish care? No .   Dental  Screening: Recommended annual dental exams for proper oral hygiene  Community Resource Referral / Chronic Care Management: CRR required this visit?  No   CCM required this visit?  No      Plan:     I have personally reviewed and noted the following in the patients chart:   Medical and social history Use of alcohol, tobacco or illicit drugs  Current medications and supplements including opioid prescriptions. Patient is not currently taking opioid prescriptions. Functional ability and status Nutritional status Physical activity Advanced directives List of other physicians Hospitalizations, surgeries, and ER visits in previous 12 months Vitals Screenings to include cognitive, depression, and falls Referrals and appointments  In addition, I have reviewed and discussed with patient certain preventive protocols, quality metrics, and best practice recommendations. A written personalized care plan for preventive services as well as general preventive health recommendations were provided to patient.     Chriss Driver, LPN   09/26/7207   Nurse Notes: Pt is up to date on health maintenance and vaccines. Discussed DEXA and how to obtain. Pt declined 6CIT.

## 2021-05-29 NOTE — Patient Instructions (Signed)
Stacy Day , Thank you for taking time to come for your Medicare Wellness Visit. I appreciate your ongoing commitment to your health goals. Please review the following plan we discussed and let me know if I can assist you in the future.   Screening recommendations/referrals: Colonoscopy: Done 10/12/2016 Repeat in 10 years  Mammogram: Done 03/25/2021 Repeat annually  Bone Density: Discussed, can schedule at any time.  Recommended yearly ophthalmology/optometry visit for glaucoma screening and checkup Recommended yearly dental visit for hygiene and checkup  Vaccinations: Influenza vaccine: Done 01/25/2020 Repeat annually  Pneumococcal vaccine: Done 12/11/2020, Prevnar 20 Tdap vaccine: Done 09/28/2016 Repeat in 10 years  Shingles vaccine: Done 10/20/2019 and 12/21/2019   Covid-19:Done 05/26/2019, 06/21/2019, 01/04/2020 and 01/27/2021.  Advanced directives: Please bring a copy of your health care power of attorney and living will to the office to be added to your chart at your convenience.   Conditions/risks identified: KEEP UP THE GOOD WORK!!  Next appointment: Follow up in one year for your annual wellness visit 2024.   Preventive Care 60 Years and Older, Female Preventive care refers to lifestyle choices and visits with your health care provider that can promote health and wellness. What does preventive care include? A yearly physical exam. This is also called an annual well check. Dental exams once or twice a year. Routine eye exams. Ask your health care provider how often you should have your eyes checked. Personal lifestyle choices, including: Daily care of your teeth and gums. Regular physical activity. Eating a healthy diet. Avoiding tobacco and drug use. Limiting alcohol use. Practicing safe sex. Taking low-dose aspirin every day. Taking vitamin and mineral supplements as recommended by your health care provider. What happens during an annual well check? The services and  screenings done by your health care provider during your annual well check will depend on your age, overall health, lifestyle risk factors, and family history of disease. Counseling  Your health care provider may ask you questions about your: Alcohol use. Tobacco use. Drug use. Emotional well-being. Home and relationship well-being. Sexual activity. Eating habits. History of falls. Memory and ability to understand (cognition). Work and work Statistician. Reproductive health. Screening  You may have the following tests or measurements: Height, weight, and BMI. Blood pressure. Lipid and cholesterol levels. These may be checked every 5 years, or more frequently if you are over 39 years old. Skin check. Lung cancer screening. You may have this screening every year starting at age 24 if you have a 30-pack-year history of smoking and currently smoke or have quit within the past 15 years. Fecal occult blood test (FOBT) of the stool. You may have this test every year starting at age 21. Flexible sigmoidoscopy or colonoscopy. You may have a sigmoidoscopy every 5 years or a colonoscopy every 10 years starting at age 72. Hepatitis C blood test. Hepatitis B blood test. Sexually transmitted disease (STD) testing. Diabetes screening. This is done by checking your blood sugar (glucose) after you have not eaten for a while (fasting). You may have this done every 1-3 years. Bone density scan. This is done to screen for osteoporosis. You may have this done starting at age 92. Mammogram. This may be done every 1-2 years. Talk to your health care provider about how often you should have regular mammograms. Talk with your health care provider about your test results, treatment options, and if necessary, the need for more tests. Vaccines  Your health care provider may recommend certain vaccines, such as: Influenza  vaccine. This is recommended every year. Tetanus, diphtheria, and acellular pertussis (Tdap,  Td) vaccine. You may need a Td booster every 10 years. Zoster vaccine. You may need this after age 22. Pneumococcal 13-valent conjugate (PCV13) vaccine. One dose is recommended after age 51. Pneumococcal polysaccharide (PPSV23) vaccine. One dose is recommended after age 18. Talk to your health care provider about which screenings and vaccines you need and how often you need them. This information is not intended to replace advice given to you by your health care provider. Make sure you discuss any questions you have with your health care provider. Document Released: 04/05/2015 Document Revised: 11/27/2015 Document Reviewed: 01/08/2015 Elsevier Interactive Patient Education  2017 [place] Prevention in the Home Falls can cause injuries. They can happen to people of all ages. There are many things you can do to make your home safe and to help prevent falls. What can I do on the outside of my home? Regularly fix the edges of walkways and driveways and fix any cracks. Remove anything that might make you trip as you walk through a door, such as a raised step or threshold. Trim any bushes or trees on the path to your home. Use bright outdoor lighting. Clear any walking paths of anything that might make someone trip, such as rocks or tools. Regularly check to see if handrails are loose or broken. Make sure that both sides of any steps have handrails. Any raised decks and porches should have guardrails on the edges. Have any leaves, snow, or ice cleared regularly. Use sand or salt on walking paths during winter. Clean up any spills in your garage right away. This includes oil or grease spills. What can I do in the bathroom? Use night lights. Install grab bars by the toilet and in the tub and shower. Do not use towel bars as grab bars. Use non-skid mats or decals in the tub or shower. If you need to sit down in the shower, use a plastic, non-slip stool. Keep the floor dry. Clean up any  water that spills on the floor as soon as it happens. Remove soap buildup in the tub or shower regularly. Attach bath mats securely with double-sided non-slip rug tape. Do not have throw rugs and other things on the floor that can make you trip. What can I do in the bedroom? Use night lights. Make sure that you have a light by your bed that is easy to reach. Do not use any sheets or blankets that are too big for your bed. They should not hang down onto the floor. Have a firm chair that has side arms. You can use this for support while you get dressed. Do not have throw rugs and other things on the floor that can make you trip. What can I do in the kitchen? Clean up any spills right away. Avoid walking on wet floors. Keep items that you use a lot in easy-to-reach places. If you need to reach something above you, use a strong step stool that has a grab bar. Keep electrical cords out of the way. Do not use floor polish or wax that makes floors slippery. If you must use wax, use non-skid floor wax. Do not have throw rugs and other things on the floor that can make you trip. What can I do with my stairs? Do not leave any items on the stairs. Make sure that there are handrails on both sides of the stairs and use them. Fix  handrails that are broken or loose. Make sure that handrails are as long as the stairways. Check any carpeting to make sure that it is firmly attached to the stairs. Fix any carpet that is loose or worn. Avoid having throw rugs at the top or bottom of the stairs. If you do have throw rugs, attach them to the floor with carpet tape. Make sure that you have a light switch at the top of the stairs and the bottom of the stairs. If you do not have them, ask someone to add them for you. What else can I do to help prevent falls? Wear shoes that: Do not have high heels. Have rubber bottoms. Are comfortable and fit you well. Are closed at the toe. Do not wear sandals. If you use a  stepladder: Make sure that it is fully opened. Do not climb a closed stepladder. Make sure that both sides of the stepladder are locked into place. Ask someone to hold it for you, if possible. Clearly mark and make sure that you can see: Any grab bars or handrails. First and last steps. Where the edge of each step is. Use tools that help you move around (mobility aids) if they are needed. These include: Canes. Walkers. Scooters. Crutches. Turn on the lights when you go into a dark area. Replace any light bulbs as soon as they burn out. Set up your furniture so you have a clear path. Avoid moving your furniture around. If any of your floors are uneven, fix them. If there are any pets around you, be aware of where they are. Review your medicines with your doctor. Some medicines can make you feel dizzy. This can increase your chance of falling. Ask your doctor what other things that you can do to help prevent falls. This information is not intended to replace advice given to you by your health care provider. Make sure you discuss any questions you have with your health care provider. Document Released: 01/03/2009 Document Revised: 08/15/2015 Document Reviewed: 04/13/2014 Elsevier Interactive Patient Education  2017 [employer].

## 2021-08-26 DIAGNOSIS — L239 Allergic contact dermatitis, unspecified cause: Secondary | ICD-10-CM | POA: Diagnosis not present

## 2021-09-02 DIAGNOSIS — Z1211 Encounter for screening for malignant neoplasm of colon: Secondary | ICD-10-CM | POA: Diagnosis not present

## 2021-09-02 DIAGNOSIS — Z86010 Personal history of colonic polyps: Secondary | ICD-10-CM | POA: Diagnosis not present

## 2021-09-02 DIAGNOSIS — K52832 Lymphocytic colitis: Secondary | ICD-10-CM | POA: Diagnosis not present

## 2021-09-29 ENCOUNTER — Ambulatory Visit (INDEPENDENT_AMBULATORY_CARE_PROVIDER_SITE_OTHER): Payer: Medicare Other | Admitting: Family Medicine

## 2021-09-29 VITALS — BP 115/80 | HR 69 | Temp 98.1°F | Ht 64.0 in | Wt 121.0 lb

## 2021-09-29 DIAGNOSIS — Z8249 Family history of ischemic heart disease and other diseases of the circulatory system: Secondary | ICD-10-CM | POA: Diagnosis not present

## 2021-09-29 DIAGNOSIS — Z13220 Encounter for screening for lipoid disorders: Secondary | ICD-10-CM | POA: Diagnosis not present

## 2021-09-29 DIAGNOSIS — Z136 Encounter for screening for cardiovascular disorders: Secondary | ICD-10-CM | POA: Diagnosis not present

## 2021-09-29 DIAGNOSIS — E782 Mixed hyperlipidemia: Secondary | ICD-10-CM | POA: Diagnosis not present

## 2021-09-29 NOTE — Progress Notes (Signed)
Subjective:    Patient ID: Stacy Day, female    DOB: Apr 18, 1955, 66 y.o.   MRN: [id number]  HPI Patient is a very pleasant 66 year old Caucasian female who is here today requesting that I order a CT coronary calcium score.  Her sister is having this test performed and she would like to have performed as well.  She denies any chest pain or shortness of breath.  She has not had her cholesterol checked recently.  She has no history of hypertension or hyperlipidemia.  However she does have a family history of coronary artery disease in her father and her grandparents.  There has been no premature coronary artery disease with a man in his 30s or a woman in her 45s.  She realizes insurance will not pay for this test and she is willing to pay out-of-pocket. No past medical history on file. No past surgical history on file. Current Outpatient Medications on File Prior to Visit  Medication Sig Dispense Refill   ALPRAZolam (XANAX) 0.5 MG tablet alprazolam 0.5 mg tablet  TAKE 1 TABLET BY MOUTH ONCE DAILY AS NEEDED FOR 15 DAYS     Cetirizine HCl (ZYRTEC ALLERGY) 10 MG CAPS      Cholecalciferol (VITAMIN D PO) Take 1 tablet by mouth daily.     Flaxseed, Linseed, (FLAXSEED OIL PO) Take by mouth.     ibuprofen (ADVIL) 800 MG tablet Take 1 tablet (800 mg total) by mouth every 8 (eight) hours as needed. 30 tablet 0   Turmeric 450 MG CAPS      zinc gluconate 50 MG tablet Take 50 mg by mouth daily.     loratadine (CLARITIN) 10 MG tablet Take 10 mg by mouth daily. (Patient not taking: Reported on 09/29/2021)     Multiple Vitamins-Minerals (PRESERVISION AREDS 2+MULTI VIT) CAPS  (Patient not taking: Reported on 09/29/2021)     traMADol (ULTRAM) 50 MG tablet Take 50 mg by mouth every 6 (six) hours as needed. (Patient not taking: Reported on 09/29/2021)     No current facility-administered medications on file prior to visit.   No Known Allergies Social History   Socioeconomic History   Marital status:  Single    Spouse name: Not on file   Number of children: Not on file   Years of education: Not on file   Highest education level: Not on file  Occupational History   Not on file  Tobacco Use   Smoking status: Never   Smokeless tobacco: Never  Substance and Sexual Activity   Alcohol use: Never   Drug use: Never   Sexual activity: Not on file  Other Topics Concern   Not on file  Social History Narrative   Not on file   Social Determinants of Health   Financial Resource Strain: Low Risk  (05/29/2021)   Overall Financial Resource Strain (CARDIA)    Difficulty of Paying Living Expenses: Not hard at all  Food Insecurity: No Food Insecurity (05/29/2021)   Hunger Vital Sign    Worried About Running Out of Food in the Last Year: Never true    [place] in the Last Year: Never true  Transportation Needs: No Transportation Needs (05/29/2021)   PRAPARE - Hydrologist (Medical): No    Lack of Transportation (Non-Medical): No  Physical Activity: Sufficiently Active (05/29/2021)   Exercise Vital Sign    Days of Exercise per Week: 5 days    Minutes of Exercise per Session:  30 min  Stress: No Stress Concern Present (05/29/2021)   [place]    Feeling of Stress : Not at all  Social Connections: Moderately Isolated (05/29/2021)   Social Connection and Isolation Panel [NHANES]    Frequency of Communication with Friends and Family: More than three times a week    Frequency of Social Gatherings with Friends and Family: More than three times a week    Attends Religious Services: Never    Marine scientist or Organizations: Yes    Attends Music therapist: More than 4 times per year    Marital Status: Never married  Intimate Partner Violence: Not At Risk (05/29/2021)   Humiliation, Afraid, Rape, and Kick questionnaire    Fear of Current or Ex-Partner: No    Emotionally Abused: No     Physically Abused: No    Sexually Abused: No     Review of Systems  All other systems reviewed and are negative.      Objective:   Physical Exam Constitutional:      General: She is not in acute distress.    Appearance: Normal appearance. She is normal weight. She is not ill-appearing or toxic-appearing.  HENT:     Nose:     Right Sinus: Maxillary sinus tenderness and frontal sinus tenderness present.     Left Sinus: Maxillary sinus tenderness and frontal sinus tenderness present.     Mouth/Throat:     Mouth: Mucous membranes are moist.     Pharynx: No oropharyngeal exudate or posterior oropharyngeal erythema.  Cardiovascular:     Rate and Rhythm: Normal rate and regular rhythm.     Heart sounds: Normal heart sounds.  Pulmonary:     Effort: Pulmonary effort is normal.  Neurological:     Mental Status: She is alert.           Assessment & Plan:  Screening cholesterol level - Plan: Lipid panel, COMPLETE METABOLIC PANEL WITH GFR  Family history of coronary artery disease - Plan: CT CARDIAC SCORING (SELF PAY ONLY) I will be glad to schedule the patient for the CT cardiac scoring.  I explained to the patient that without symptoms, the test would only be used to determine if she needs a statin.  We could accomplish something similar by checking her cholesterol.  However the patient would like to have the test for her own peace of mind.

## 2021-09-30 LAB — COMPLETE METABOLIC PANEL WITH GFR
AG Ratio: 2.2 (calc) (ref 1.0–2.5)
ALT: 16 U/L (ref 6–29)
AST: 17 U/L (ref 10–35)
Albumin: 4.4 g/dL (ref 3.6–5.1)
Alkaline phosphatase (APISO): 99 U/L (ref 37–153)
BUN: 10 mg/dL (ref 7–25)
CO2: 27 mmol/L (ref 20–32)
Calcium: 9.4 mg/dL (ref 8.6–10.4)
Chloride: 104 mmol/L (ref 98–110)
Creat: 0.76 mg/dL (ref 0.50–1.05)
Globulin: 2 g/dL (calc) (ref 1.9–3.7)
Glucose, Bld: 92 mg/dL (ref 65–99)
Potassium: 4.3 mmol/L (ref 3.5–5.3)
Sodium: 140 mmol/L (ref 135–146)
Total Bilirubin: 0.3 mg/dL (ref 0.2–1.2)
Total Protein: 6.4 g/dL (ref 6.1–8.1)
eGFR: 86 mL/min/{1.73_m2} (ref >=60)

## 2021-09-30 LAB — LIPID PANEL
Cholesterol: 185 mg/dL (ref <200)
HDL: 58 mg/dL (ref >=50)
LDL Cholesterol (Calc): 101 mg/dL (calc) — ABNORMAL HIGH
Non-HDL Cholesterol (Calc): 127 mg/dL (calc) (ref <130)
Total CHOL/HDL Ratio: 3.2 (calc) (ref <5.0)
Triglycerides: 163 mg/dL — ABNORMAL HIGH (ref <150)

## 2021-10-15 DIAGNOSIS — K6389 Other specified diseases of intestine: Secondary | ICD-10-CM | POA: Diagnosis not present

## 2021-10-15 DIAGNOSIS — K5140 Inflammatory polyps of colon without complications: Secondary | ICD-10-CM | POA: Diagnosis not present

## 2021-10-15 DIAGNOSIS — K52832 Lymphocytic colitis: Secondary | ICD-10-CM | POA: Diagnosis not present

## 2021-10-15 DIAGNOSIS — Z86011 Personal history of benign neoplasm of the brain: Secondary | ICD-10-CM | POA: Diagnosis not present

## 2021-10-15 DIAGNOSIS — Z1211 Encounter for screening for malignant neoplasm of colon: Secondary | ICD-10-CM | POA: Diagnosis not present

## 2021-10-15 DIAGNOSIS — K635 Polyp of colon: Secondary | ICD-10-CM | POA: Diagnosis not present

## 2021-10-15 DIAGNOSIS — Z86010 Personal history of colonic polyps: Secondary | ICD-10-CM | POA: Diagnosis not present

## 2021-10-15 DIAGNOSIS — D122 Benign neoplasm of ascending colon: Secondary | ICD-10-CM | POA: Diagnosis not present

## 2021-10-15 LAB — HM COLONOSCOPY

## 2021-10-16 ENCOUNTER — Ambulatory Visit (HOSPITAL_BASED_OUTPATIENT_CLINIC_OR_DEPARTMENT_OTHER)
Admission: RE | Admit: 2021-10-16 | Discharge: 2021-10-16 | Disposition: A | Discharge status: Home / Self Care | Payer: Medicare Other | Source: Ambulatory Visit | Attending: Family Medicine | Admitting: Family Medicine

## 2021-10-16 DIAGNOSIS — Z8249 Family history of ischemic heart disease and other diseases of the circulatory system: Secondary | ICD-10-CM | POA: Insufficient documentation

## 2021-12-15 ENCOUNTER — Other Ambulatory Visit (HOSPITAL_BASED_OUTPATIENT_CLINIC_OR_DEPARTMENT_OTHER): Payer: Self-pay

## 2021-12-15 MED ORDER — FLUAD QUADRIVALENT 0.5 ML IM PRSY
PREFILLED_SYRINGE | INTRAMUSCULAR | 0 refills | Status: DC
  Filled 2021-12-15: qty 0.5, 1d supply, fill #0

## 2021-12-17 DIAGNOSIS — L814 Other melanin hyperpigmentation: Secondary | ICD-10-CM | POA: Diagnosis not present

## 2021-12-17 DIAGNOSIS — D225 Melanocytic nevi of trunk: Secondary | ICD-10-CM | POA: Diagnosis not present

## 2021-12-17 DIAGNOSIS — L821 Other seborrheic keratosis: Secondary | ICD-10-CM | POA: Diagnosis not present

## 2021-12-17 DIAGNOSIS — L603 Nail dystrophy: Secondary | ICD-10-CM | POA: Diagnosis not present

## 2021-12-17 DIAGNOSIS — L820 Inflamed seborrheic keratosis: Secondary | ICD-10-CM | POA: Diagnosis not present

## 2021-12-17 DIAGNOSIS — L578 Other skin changes due to chronic exposure to nonionizing radiation: Secondary | ICD-10-CM | POA: Diagnosis not present

## 2022-01-06 DIAGNOSIS — H52203 Unspecified astigmatism, bilateral: Secondary | ICD-10-CM | POA: Diagnosis not present

## 2022-01-06 DIAGNOSIS — H26492 Other secondary cataract, left eye: Secondary | ICD-10-CM | POA: Diagnosis not present

## 2022-01-06 DIAGNOSIS — H43813 Vitreous degeneration, bilateral: Secondary | ICD-10-CM | POA: Diagnosis not present

## 2022-01-06 DIAGNOSIS — H40013 Open angle with borderline findings, low risk, bilateral: Secondary | ICD-10-CM | POA: Diagnosis not present

## 2022-01-06 DIAGNOSIS — H524 Presbyopia: Secondary | ICD-10-CM | POA: Diagnosis not present

## 2022-01-06 DIAGNOSIS — H2511 Age-related nuclear cataract, right eye: Secondary | ICD-10-CM | POA: Diagnosis not present

## 2022-01-06 DIAGNOSIS — H353132 Nonexudative age-related macular degeneration, bilateral, intermediate dry stage: Secondary | ICD-10-CM | POA: Diagnosis not present

## 2022-02-02 DIAGNOSIS — B3731 Acute candidiasis of vulva and vagina: Secondary | ICD-10-CM | POA: Diagnosis not present

## 2022-02-10 ENCOUNTER — Other Ambulatory Visit: Payer: Self-pay | Admitting: Obstetrics and Gynecology

## 2022-02-10 DIAGNOSIS — Z1231 Encounter for screening mammogram for malignant neoplasm of breast: Secondary | ICD-10-CM

## 2022-02-10 DIAGNOSIS — H26492 Other secondary cataract, left eye: Secondary | ICD-10-CM | POA: Diagnosis not present

## 2022-02-17 DIAGNOSIS — M25511 Pain in right shoulder: Secondary | ICD-10-CM | POA: Diagnosis not present

## 2022-02-25 DIAGNOSIS — H40022 Open angle with borderline findings, high risk, left eye: Secondary | ICD-10-CM | POA: Diagnosis not present

## 2022-02-26 DIAGNOSIS — M6281 Muscle weakness (generalized): Secondary | ICD-10-CM | POA: Diagnosis not present

## 2022-02-26 DIAGNOSIS — S46011D Strain of muscle(s) and tendon(s) of the rotator cuff of right shoulder, subsequent encounter: Secondary | ICD-10-CM | POA: Diagnosis not present

## 2022-02-26 DIAGNOSIS — M25611 Stiffness of right shoulder, not elsewhere classified: Secondary | ICD-10-CM | POA: Diagnosis not present

## 2022-03-04 DIAGNOSIS — S46011D Strain of muscle(s) and tendon(s) of the rotator cuff of right shoulder, subsequent encounter: Secondary | ICD-10-CM | POA: Diagnosis not present

## 2022-03-04 DIAGNOSIS — M25611 Stiffness of right shoulder, not elsewhere classified: Secondary | ICD-10-CM | POA: Diagnosis not present

## 2022-03-04 DIAGNOSIS — M6281 Muscle weakness (generalized): Secondary | ICD-10-CM | POA: Diagnosis not present

## 2022-03-11 DIAGNOSIS — M6281 Muscle weakness (generalized): Secondary | ICD-10-CM | POA: Diagnosis not present

## 2022-03-11 DIAGNOSIS — S46011D Strain of muscle(s) and tendon(s) of the rotator cuff of right shoulder, subsequent encounter: Secondary | ICD-10-CM | POA: Diagnosis not present

## 2022-03-11 DIAGNOSIS — M25611 Stiffness of right shoulder, not elsewhere classified: Secondary | ICD-10-CM | POA: Diagnosis not present

## 2022-04-13 ENCOUNTER — Ambulatory Visit
Admission: RE | Admit: 2022-04-13 | Discharge: 2022-04-13 | Disposition: A | Discharge status: Home / Self Care | Payer: Medicare Other | Source: Ambulatory Visit | Attending: Obstetrics and Gynecology | Admitting: Obstetrics and Gynecology

## 2022-04-13 DIAGNOSIS — Z1231 Encounter for screening mammogram for malignant neoplasm of breast: Secondary | ICD-10-CM | POA: Diagnosis not present

## 2022-04-14 DIAGNOSIS — S46011D Strain of muscle(s) and tendon(s) of the rotator cuff of right shoulder, subsequent encounter: Secondary | ICD-10-CM | POA: Diagnosis not present

## 2022-05-01 ENCOUNTER — Ambulatory Visit (INDEPENDENT_AMBULATORY_CARE_PROVIDER_SITE_OTHER): Payer: Medicare Other | Admitting: Family Medicine

## 2022-05-01 ENCOUNTER — Encounter: Payer: Self-pay | Admitting: Family Medicine

## 2022-05-01 VITALS — BP 116/62 | HR 77 | Temp 98.5°F | Ht 64.0 in | Wt 127.0 lb

## 2022-05-01 DIAGNOSIS — L309 Dermatitis, unspecified: Secondary | ICD-10-CM

## 2022-05-01 MED ORDER — DESONIDE 0.05 % EX CREA: TOPICAL_CREAM | Freq: Two times a day (BID) | CUTANEOUS | 0 refills | Status: AC

## 2022-05-01 MED ORDER — DEXAMETHASONE SODIUM PHOSPHATE 0.1 % OP SOLN: 2.0000 [drp] | OPHTHALMIC | 0 refills | Status: DC

## 2022-05-01 NOTE — Progress Notes (Signed)
Subjective:    Patient ID: Stacy Day, female    DOB: 1955/12/19, 67 y.o.   MRN: [id number]  Eye Problem   For the last month, the patient has had red peeling skin around bilateral eyelids both upper and lower.  She is also having red irritated skin around her nose and mouth.  She treated this with hydrocortisone cream and it has improved however she still reports itching and burning in her upper and lower eyelids bilaterally.  There is no evidence of cellulitis.  There is no evidence of blepharitis.  There is no evidence of any obstruction of the meibomian glands at the base of the eyelashes.  She denies any history of rosacea.  She attributes this to dry irritated skin.  She has been trying Vaseline with minimal improvement No past medical history on file. No past surgical history on file. Current Outpatient Medications on File Prior to Visit  Medication Sig Dispense Refill   ALPRAZolam (XANAX) 0.5 MG tablet alprazolam 0.5 mg tablet  TAKE 1 TABLET BY MOUTH ONCE DAILY AS NEEDED FOR 15 DAYS     Cetirizine HCl (ZYRTEC ALLERGY) 10 MG CAPS      Cholecalciferol (VITAMIN D PO) Take 1 tablet by mouth daily.     Flaxseed, Linseed, (FLAXSEED OIL PO) Take by mouth.     ibuprofen (ADVIL) 800 MG tablet Take 1 tablet (800 mg total) by mouth every 8 (eight) hours as needed. 30 tablet 0   influenza vaccine adjuvanted (FLUAD QUADRIVALENT) 0.5 ML injection Inject into the muscle. 0.5 mL 0   Multiple Vitamins-Minerals (PRESERVISION AREDS 2+MULTI VIT) CAPS  (Patient not taking: Reported on 09/29/2021)     traMADol (ULTRAM) 50 MG tablet Take 50 mg by mouth every 6 (six) hours as needed. (Patient not taking: Reported on 09/29/2021)     Turmeric 450 MG CAPS      zinc gluconate 50 MG tablet Take 50 mg by mouth daily.     No current facility-administered medications on file prior to visit.   No Known Allergies Social History   Socioeconomic History   Marital status: Single    Spouse name: Not on file    Number of children: Not on file   Years of education: Not on file   Highest education level: Not on file  Occupational History   Not on file  Tobacco Use   Smoking status: Never   Smokeless tobacco: Never  Substance and Sexual Activity   Alcohol use: Never   Drug use: Never   Sexual activity: Not on file  Other Topics Concern   Not on file  Social History Narrative   Not on file   Social Determinants of Health   Financial Resource Strain: Low Risk  (05/29/2021)   Overall Financial Resource Strain (CARDIA)    Difficulty of Paying Living Expenses: Not hard at all  Food Insecurity: No Food Insecurity (05/29/2021)   Hunger Vital Sign    Worried About Running Out of Food in the Last Year: Never true    [place] in the Last Year: Never true  Transportation Needs: No Transportation Needs (05/29/2021)   PRAPARE - Hydrologist (Medical): No    Lack of Transportation (Non-Medical): No  Physical Activity: Sufficiently Active (05/29/2021)   Exercise Vital Sign    Days of Exercise per Week: 5 days    Minutes of Exercise per Session: 30 min  Stress: No Stress Concern Present (05/29/2021)  [employer] - Occupational Stress Questionnaire    Feeling of Stress : Not at all  Social Connections: Moderately Isolated (05/29/2021)   Social Connection and Isolation Panel [NHANES]    Frequency of Communication with Friends and Family: More than three times a week    Frequency of Social Gatherings with Friends and Family: More than three times a week    Attends Religious Services: Never    Marine scientist or Organizations: Yes    Attends Music therapist: More than 4 times per year    Marital Status: Never married  Intimate Partner Violence: Not At Risk (05/29/2021)   Humiliation, Afraid, Rape, and Kick questionnaire    Fear of Current or Ex-Partner: No    Emotionally Abused: No    Physically Abused: No    Sexually  Abused: No     Review of Systems  All other systems reviewed and are negative.      Objective:   Physical Exam Constitutional:      General: She is not in acute distress.    Appearance: Normal appearance. She is normal weight. She is not ill-appearing or toxic-appearing.  HENT:     Nose: No congestion or rhinorrhea.     Mouth/Throat:     Mouth: Mucous membranes are moist.     Pharynx: No oropharyngeal exudate or posterior oropharyngeal erythema.  Eyes:     General: Lids are everted, no foreign bodies appreciated. Vision grossly intact. Gaze aligned appropriately. No scleral icterus.       Right eye: No foreign body, discharge or hordeolum.        Left eye: No foreign body, discharge or hordeolum.     Conjunctiva/sclera: Conjunctivae normal.     Right eye: Right conjunctiva is not injected. No chemosis or exudate.    Left eye: Left conjunctiva is not injected. No chemosis or exudate. Cardiovascular:     Rate and Rhythm: Normal rate and regular rhythm.     Heart sounds: Normal heart sounds.  Pulmonary:     Effort: Pulmonary effort is normal.  Neurological:     Mental Status: She is alert.           Assessment & Plan:  Dermatitis This potentially could be atopic dermatitis versus mild blepharitis versus periocular dermatitis.  Try dexamethasone ophthalmic solution 3 drops on the skin of the eyelids 3 times daily.  I explained to the patient that I did not want her to put the cream in the eye itself but rather to put 3 drops on her eyes while they are closed and use her finger to rub that into the skin around the eyelids.  Will try this for 3 days.  If this calms down the irritation she is to discontinue this and switch back to a Vaseline ointment.  If not improving, I would consider rosacea versus periocular dermatitis versus blepharitis.

## 2022-05-21 ENCOUNTER — Telehealth: Payer: Self-pay

## 2022-05-21 NOTE — Telephone Encounter (Signed)
PA for desonide (DESOWEN) 0.05 % cream [id number]  sent to plan.   Waiting for response.

## 2022-05-25 NOTE — Telephone Encounter (Signed)
PA for desonide (DESOWEN) 0.05 % cream [id number]   Denied by insurance Denied on February 29 Request Reference Number: [id number]. DESONIDE CRE 0.05% is denied for not meeting the prior authorization requirement(s).

## 2022-06-03 NOTE — Patient Instructions (Incomplete)
[name] , Thank you for taking time to come for your Medicare Wellness Visit. I appreciate your ongoing commitment to your health goals. Please review the following plan we discussed and let me know if I can assist you in the future.   These are the goals we discussed:  Goals      Exercise 3x per week (30 min per time)     Continue to exercise and stay healthy. Travel more.         This is a list of the screening recommended for you and due dates:  Health Maintenance  Topic Date Due   Hepatitis C Screening: USPSTF Recommendation to screen - Ages 17-79 yo.  Never done   DEXA scan (bone density measurement)  Never done   COVID-19 Vaccine (5 - 2023-24 season) 11/21/2021   Medicare Annual Wellness Visit  06/04/2023   Mammogram  04/13/2024   DTaP/Tdap/Td vaccine (2 - Td or Tdap) 09/29/2026   Colon Cancer Screening  10/13/2026   Pneumonia Vaccine  Completed   Flu Shot  Completed   Zoster (Shingles) Vaccine  Completed   HPV Vaccine  Aged Out    Advanced directives: Please bring a copy of your health care power of attorney and living will to the office to be added to your chart at your convenience.   Conditions/risks identified: Aim for 30 minutes of exercise or brisk walking, 6-8 glasses of water, and 5 servings of fruits and vegetables each day.   Next appointment: Follow up in one year for your annual wellness visit    Preventive Care 65 Years and Older, Female Preventive care refers to lifestyle choices and visits with your health care provider that can promote health and wellness. What does preventive care include? A yearly physical exam. This is also called an annual well check. Dental exams once or twice a year. Routine eye exams. Ask your health care provider how often you should have your eyes checked. Personal lifestyle choices, including: Daily care of your teeth and gums. Regular physical activity. Eating a healthy diet. Avoiding tobacco and drug use. Limiting  alcohol use. Practicing safe sex. Taking low-dose aspirin every day. Taking vitamin and mineral supplements as recommended by your health care provider. What happens during an annual well check? The services and screenings done by your health care provider during your annual well check will depend on your age, overall health, lifestyle risk factors, and family history of disease. Counseling  Your health care provider may ask you questions about your: Alcohol use. Tobacco use. Drug use. Emotional well-being. Home and relationship well-being. Sexual activity. Eating habits. History of falls. Memory and ability to understand (cognition). Work and work Statistician. Reproductive health. Screening  You may have the following tests or measurements: Height, weight, and BMI. Blood pressure. Lipid and cholesterol levels. These may be checked every 5 years, or more frequently if you are over 48 years old. Skin check. Lung cancer screening. You may have this screening every year starting at age 51 if you have a 30-pack-year history of smoking and currently smoke or have quit within the past 15 years. Fecal occult blood test (FOBT) of the stool. You may have this test every year starting at age 68. Flexible sigmoidoscopy or colonoscopy. You may have a sigmoidoscopy every 5 years or a colonoscopy every 10 years starting at age 36. Hepatitis C blood test. Hepatitis B blood test. Sexually transmitted disease (STD) testing. Diabetes screening. This is done by checking your blood sugar (  glucose) after you have not eaten for a while (fasting). You may have this done every 1-3 years. Bone density scan. This is done to screen for osteoporosis. You may have this done starting at age 35. Mammogram. This may be done every 1-2 years. Talk to your health care provider about how often you should have regular mammograms. Talk with your health care provider about your test results, treatment options, and if  necessary, the need for more tests. Vaccines  Your health care provider may recommend certain vaccines, such as: Influenza vaccine. This is recommended every year. Tetanus, diphtheria, and acellular pertussis (Tdap, Td) vaccine. You may need a Td booster every 10 years. Zoster vaccine. You may need this after age 78. Pneumococcal 13-valent conjugate (PCV13) vaccine. One dose is recommended after age 63. Pneumococcal polysaccharide (PPSV23) vaccine. One dose is recommended after age 81. Talk to your health care provider about which screenings and vaccines you need and how often you need them. This information is not intended to replace advice given to you by your health care provider. Make sure you discuss any questions you have with your health care provider. Document Released: 04/05/2015 Document Revised: 11/27/2015 Document Reviewed: 01/08/2015 Elsevier Interactive Patient Education  2017 [place] Prevention in the Home Falls can cause injuries. They can happen to people of all ages. There are many things you can do to make your home safe and to help prevent falls. What can I do on the outside of my home? Regularly fix the edges of walkways and driveways and fix any cracks. Remove anything that might make you trip as you walk through a door, such as a raised step or threshold. Trim any bushes or trees on the path to your home. Use bright outdoor lighting. Clear any walking paths of anything that might make someone trip, such as rocks or tools. Regularly check to see if handrails are loose or broken. Make sure that both sides of any steps have handrails. Any raised decks and porches should have guardrails on the edges. Have any leaves, snow, or ice cleared regularly. Use sand or salt on walking paths during winter. Clean up any spills in your garage right away. This includes oil or grease spills. What can I do in the bathroom? Use night lights. Install grab bars by the toilet  and in the tub and shower. Do not use towel bars as grab bars. Use non-skid mats or decals in the tub or shower. If you need to sit down in the shower, use a plastic, non-slip stool. Keep the floor dry. Clean up any water that spills on the floor as soon as it happens. Remove soap buildup in the tub or shower regularly. Attach bath mats securely with double-sided non-slip rug tape. Do not have throw rugs and other things on the floor that can make you trip. What can I do in the bedroom? Use night lights. Make sure that you have a light by your bed that is easy to reach. Do not use any sheets or blankets that are too big for your bed. They should not hang down onto the floor. Have a firm chair that has side arms. You can use this for support while you get dressed. Do not have throw rugs and other things on the floor that can make you trip. What can I do in the kitchen? Clean up any spills right away. Avoid walking on wet floors. Keep items that you use a lot in easy-to-reach places.  If you need to reach something above you, use a strong step stool that has a grab bar. Keep electrical cords out of the way. Do not use floor polish or wax that makes floors slippery. If you must use wax, use non-skid floor wax. Do not have throw rugs and other things on the floor that can make you trip. What can I do with my stairs? Do not leave any items on the stairs. Make sure that there are handrails on both sides of the stairs and use them. Fix handrails that are broken or loose. Make sure that handrails are as long as the stairways. Check any carpeting to make sure that it is firmly attached to the stairs. Fix any carpet that is loose or worn. Avoid having throw rugs at the top or bottom of the stairs. If you do have throw rugs, attach them to the floor with carpet tape. Make sure that you have a light switch at the top of the stairs and the bottom of the stairs. If you do not have them, ask someone to add  them for you. What else can I do to help prevent falls? Wear shoes that: Do not have high heels. Have rubber bottoms. Are comfortable and fit you well. Are closed at the toe. Do not wear sandals. If you use a stepladder: Make sure that it is fully opened. Do not climb a closed stepladder. Make sure that both sides of the stepladder are locked into place. Ask someone to hold it for you, if possible. Clearly mark and make sure that you can see: Any grab bars or handrails. First and last steps. Where the edge of each step is. Use tools that help you move around (mobility aids) if they are needed. These include: Canes. Walkers. Scooters. Crutches. Turn on the lights when you go into a dark area. Replace any light bulbs as soon as they burn out. Set up your furniture so you have a clear path. Avoid moving your furniture around. If any of your floors are uneven, fix them. If there are any pets around you, be aware of where they are. Review your medicines with your doctor. Some medicines can make you feel dizzy. This can increase your chance of falling. Ask your doctor what other things that you can do to help prevent falls. This information is not intended to replace advice given to you by your health care provider. Make sure you discuss any questions you have with your health care provider. Document Released: 01/03/2009 Document Revised: 08/15/2015 Document Reviewed: 04/13/2014 Elsevier Interactive Patient Education  2017 [employer].

## 2022-06-03 NOTE — Progress Notes (Signed)
Subjective:   Stacy Day is a 67 y.o. female who presents for Medicare Annual (Subsequent) preventive examination.  I connected with  Stacy Day on 06/04/22 by a audio enabled telemedicine application and verified that I am speaking with the correct person using two identifiers.  Patient Location: Home  Provider Location: Office/Clinic  I discussed the limitations of evaluation and management by telemedicine. The patient expressed understanding and agreed to proceed.  Review of Systems     Cardiac Risk Factors include: advanced age (>59men, >50 women)     Objective:    Today's Vitals   06/04/22 1122  Weight: 127 lb (57.6 kg)  Height: 5\' 4"  (1.626 m)   Body mass index is 21.8 kg/m.     06/04/2022   11:30 AM 05/29/2021   11:25 AM  Advanced Directives  Does Patient Have a Medical Advance Directive? Yes Yes  Type of Advance Directive Living will;Healthcare Power of Stacy Day;Living will  Does patient want to make changes to medical advance directive? No - Patient declined   Copy of Stacy Day in Chart? No - copy requested No - copy requested    Current Medications (verified) Outpatient Encounter Medications as of 06/04/2022  Medication Sig   ALPRAZolam (XANAX) 0.5 MG tablet alprazolam 0.5 mg tablet  TAKE 1 TABLET BY MOUTH ONCE DAILY AS NEEDED FOR 15 DAYS   Cholecalciferol (VITAMIN D PO) Take 1 tablet by mouth daily.   desonide (DESOWEN) 0.05 % cream Apply topically 2 (two) times daily.   dexamethasone (DECADRON) 0.1 % ophthalmic solution Place 2 drops into both eyes every 4 (four) hours.   Flaxseed, Linseed, (FLAXSEED OIL PO) Take by mouth.   ibuprofen (ADVIL) 800 MG tablet Take 1 tablet (800 mg total) by mouth every 8 (eight) hours as needed.   Multiple Vitamins-Minerals (PRESERVISION AREDS 2+MULTI VIT) CAPS    traMADol (ULTRAM) 50 MG tablet Take 50 mg by mouth every 6 (six) hours as needed.   [DISCONTINUED]  influenza vaccine adjuvanted (FLUAD QUADRIVALENT) 0.5 ML injection Inject into the muscle.   No facility-administered encounter medications on file as of 06/04/2022.    Allergies (verified) Patient has no known allergies.   History: No past medical history on file. No past surgical history on file. Family History  Problem Relation Age of Onset   Breast cancer Paternal Aunt    Breast cancer Cousin    Social History   Socioeconomic History   Marital status: Single    Spouse name: Not on file   Number of children: Not on file   Years of education: Not on file   Highest education level: Not on file  Occupational History   Not on file  Tobacco Use   Smoking status: Never   Smokeless tobacco: Never  Substance and Sexual Activity   Alcohol use: Never   Drug use: Never   Sexual activity: Not on file  Other Topics Concern   Not on file  Social History Narrative   Not on file   Social Determinants of Health   Financial Resource Strain: Low Risk  (06/04/2022)   Overall Financial Resource Strain (CARDIA)    Difficulty of Paying Living Expenses: Not hard at all  Food Insecurity: No Food Insecurity (06/04/2022)   Hunger Vital Sign    Worried About Running Out of Food in the Last Year: Never true    Ran Out of Food in the Last Year: Never true  Transportation Needs: No  Transportation Needs (06/04/2022)   PRAPARE - Hydrologist (Medical): No    Lack of Transportation (Non-Medical): No  Physical Activity: Sufficiently Active (06/04/2022)   Exercise Vital Sign    Days of Exercise per Week: 5 days    Minutes of Exercise per Session: 50 min  Stress: No Stress Concern Present (06/04/2022)   Stacy Day    Feeling of Stress : Not at all  Social Connections: Socially Isolated (06/04/2022)   Social Connection and Isolation Panel [NHANES]    Frequency of Communication with Friends and Family: More  than three times a week    Frequency of Social Gatherings with Friends and Family: More than three times a week    Attends Religious Services: Never    Marine scientist or Organizations: No    Attends Music therapist: Never    Marital Status: Never married    Tobacco Counseling Counseling given: Not Answered   Clinical Intake:  Pre-visit preparation completed: Yes  Pain : No/denies pain  Diabetes: No  How often do you need to have someone help you when you read instructions, pamphlets, or other written materials from your doctor or pharmacy?: 1 - Never  Diabetic?No   Interpreter Needed?: No  Information entered by :: Stacy George LPN   Activities of Daily Living    06/04/2022   11:30 AM 06/03/2022    5:39 PM  In your present state of health, do you have any difficulty performing the following activities:  Hearing? 0 0  Vision? 0 0  Difficulty concentrating or making decisions? 0 0  Walking or climbing stairs? 0 0  Dressing or bathing? 0 0  Doing errands, shopping? 0 0  Preparing Food and eating ? N N  Using the Toilet? N N  In the past six months, have you accidently leaked urine? N N  Do you have problems with loss of bowel control? N N  Managing your Medications? N N  Managing your Finances? N N  Housekeeping or managing your Housekeeping? N N    Patient Care Team: Susy Frizzle, MD as PCP - General (Family Medicine) Marygrace Drought, MD as Consulting Physician (Ophthalmology) Renda Rolls, Jennefer Bravo, MD as Referring Physician (Dermatology) Juanita Craver, MD as Consulting Physician (Gastroenterology) Paula Compton, MD as Consulting Physician (Obstetrics and Gynecology)  Indicate any recent Medical Services you may have received from other than [facility] providers in the past year (date may be approximate).     Assessment:   This is a routine wellness examination for Stacy Day.  Hearing/Vision screen Hearing Screening - Comments::  Denies hearing difficulties   Vision Screening - Comments:: up to date with routine eye exams with Dr. Satira Sark    Dietary issues and exercise activities discussed: Current Exercise Habits: Home exercise routine, Type of exercise: walking;calisthenics, Time (Minutes): 50, Frequency (Times/Week): 4, Weekly Exercise (Minutes/Week): 200, Intensity: Moderate   Goals Addressed             This Visit's Progress    Exercise 3x per week (30 min per time)   On track    Continue to exercise and stay healthy. Travel more.        Depression Screen    06/04/2022   11:26 AM 05/01/2022   10:58 AM 05/29/2021   11:23 AM  PHQ 2/9 Scores  PHQ - 2 Score 0 0 0    Fall Risk    06/04/2022  11:25 AM 06/03/2022    5:39 PM 05/01/2022   10:58 AM 05/29/2021   11:26 AM  Fall Risk   Falls in the past year? 0 0 0 0  Number falls in past yr: 0 0 0 0  Injury with Fall? 0 0 0 0  Risk for fall due to : No Fall Risks  No Fall Risks No Fall Risks  Follow up Falls prevention discussed;Education provided;Falls evaluation completed  Falls prevention discussed Falls prevention discussed    FALL RISK PREVENTION PERTAINING TO THE HOME:  Any stairs in or around the home? Yes  If so, are there any without handrails? No  Home free of loose throw rugs in walkways, pet beds, electrical cords, etc? Yes  Adequate lighting in your home to reduce risk of falls? Yes   ASSISTIVE DEVICES UTILIZED TO PREVENT FALLS:  Life alert? No  Use of a cane, walker or w/c? No  Grab bars in the bathroom? Yes  Shower chair or bench in shower? No  Elevated toilet seat or a handicapped toilet? Yes   TIMED UP AND GO:  Was the test performed? No . Telephonic visit   Cognitive Function:        06/04/2022   11:30 AM  6CIT Screen  What Year? 0 points  What month? 0 points  What time? 0 points  Count back from 20 0 points  Months in reverse 0 points  Repeat phrase 0 points  Total Score 0 points    Immunizations Immunization  History  Administered Date(s) Administered   Fluad Quad(high Dose 65+) 01/25/2020, 12/15/2021   Influenza,inj,Quad PF,6+ Mos 01/05/2013, 01/18/2014, 01/10/2015, 01/28/2016, 01/05/2017, 12/24/2017, 12/30/2018   Influenza-Unspecified 12/27/2008, 01/16/2010, 01/28/2011, 01/13/2012   PFIZER(Purple Top)SARS-COV-2 Vaccination 05/26/2019, 06/21/2019, 01/04/2020   PNEUMOCOCCAL CONJUGATE-20 12/11/2020   Pfizer Covid-19 Vaccine Bivalent Booster 38yrs & up 01/27/2021   Tdap 09/28/2016   Zoster Recombinat (Shingrix) 10/20/2019, 12/21/2019    TDAP status: Up to date  Flu Vaccine status: Up to date  Pneumococcal vaccine status: Up to date  Covid-19 vaccine status: Information provided on how to obtain vaccines.   Qualifies for Shingles Vaccine? Yes   Zostavax completed No   Shingrix Completed?: Yes  Screening Tests Health Maintenance  Topic Date Due   Hepatitis C Screening  Never done   COVID-19 Vaccine (5 - 2023-24 season) 11/21/2021   DEXA SCAN  06/04/2023 (Originally 08/14/2019)   Medicare Annual Wellness (AWV)  06/04/2023   MAMMOGRAM  04/13/2024   DTaP/Tdap/Td (2 - Td or Tdap) 09/29/2026   COLONOSCOPY (Pts 45-74yrs Insurance coverage will need to be confirmed)  10/13/2026   Pneumonia Vaccine 65+ Years old  Completed   INFLUENZA VACCINE  Completed   Zoster Vaccines- Shingrix  Completed   HPV VACCINES  Aged Out    Health Maintenance  Health Maintenance Due  Topic Date Due   Hepatitis C Screening  Never done   COVID-19 Vaccine (5 - 2023-24 season) 11/21/2021    Colorectal cancer screening: Type of screening: Colonoscopy. Completed 10/12/16. Repeat every 10 years  Mammogram status: Completed 04/13/22. Repeat every year  Bone density status: Bone Density status: Completed with gyn but patient declines further.   Lung Cancer Screening: (Low Dose CT Chest recommended if Age 42-80 years, 30 pack-year currently smoking OR have quit w/in 15years.) does not qualify.   Lung Cancer  Screening Referral: n/a  Additional Screening:  Hepatitis C Screening: does not qualify; patient declines   Vision Screening: Recommended annual ophthalmology exams for  early detection of glaucoma and other disorders of the eye. Is the patient up to date with their annual eye exam?  Yes  Who is the provider or what is the name of the office in which the patient attends annual eye exams? Dr. Satira Sark  If pt is not established with a provider, would they like to be referred to a provider to establish care? No .   Dental Screening: Recommended annual dental exams for proper oral hygiene  Community Resource Referral / Chronic Care Management: CRR required this visit?  No   CCM required this visit?  No      Plan:     I have personally reviewed and noted the following in the patient's chart:   Medical and social history Use of alcohol, tobacco or illicit drugs  Current medications and supplements including opioid prescriptions. Patient is not currently taking opioid prescriptions. Functional ability and status Nutritional status Physical activity Advanced directives List of other physicians Hospitalizations, surgeries, and ER visits in previous 12 months Vitals Screenings to include cognitive, depression, and falls Referrals and appointments  In addition, I have reviewed and discussed with patient certain preventive protocols, quality metrics, and best practice recommendations. A written personalized care plan for preventive services as well as general preventive health recommendations were provided to patient.     Vanetta Mulders, [place]   075-GRM   Due to this being a virtual visit, the after visit summary with patients personalized plan was offered to patient via mail or my-chart. Patient would like to access on my-chart  Nurse Notes: No concerns

## 2022-06-04 ENCOUNTER — Ambulatory Visit (INDEPENDENT_AMBULATORY_CARE_PROVIDER_SITE_OTHER): Payer: Medicare Other

## 2022-06-04 VITALS — Ht 64.0 in | Wt 127.0 lb

## 2022-06-04 DIAGNOSIS — Z0000 Encounter for general adult medical examination without abnormal findings: Secondary | ICD-10-CM

## 2022-06-15 ENCOUNTER — Encounter: Payer: Self-pay | Admitting: Family Medicine

## 2022-07-06 DIAGNOSIS — H40013 Open angle with borderline findings, low risk, bilateral: Secondary | ICD-10-CM | POA: Diagnosis not present

## 2022-07-06 DIAGNOSIS — H0100B Unspecified blepharitis left eye, upper and lower eyelids: Secondary | ICD-10-CM | POA: Diagnosis not present

## 2022-07-06 DIAGNOSIS — H0100A Unspecified blepharitis right eye, upper and lower eyelids: Secondary | ICD-10-CM | POA: Diagnosis not present

## 2022-07-21 DIAGNOSIS — L820 Inflamed seborrheic keratosis: Secondary | ICD-10-CM | POA: Diagnosis not present

## 2022-07-21 DIAGNOSIS — L821 Other seborrheic keratosis: Secondary | ICD-10-CM | POA: Diagnosis not present

## 2022-09-28 DIAGNOSIS — H353131 Nonexudative age-related macular degeneration, bilateral, early dry stage: Secondary | ICD-10-CM | POA: Diagnosis not present

## 2022-09-28 DIAGNOSIS — Z961 Presence of intraocular lens: Secondary | ICD-10-CM | POA: Diagnosis not present

## 2022-09-28 DIAGNOSIS — H25011 Cortical age-related cataract, right eye: Secondary | ICD-10-CM | POA: Diagnosis not present

## 2022-10-15 ENCOUNTER — Ambulatory Visit: Payer: Medicare Other | Admitting: Family Medicine

## 2022-10-15 DIAGNOSIS — H25011 Cortical age-related cataract, right eye: Secondary | ICD-10-CM | POA: Diagnosis not present

## 2022-10-27 ENCOUNTER — Ambulatory Visit (INDEPENDENT_AMBULATORY_CARE_PROVIDER_SITE_OTHER): Payer: Medicare Other | Admitting: Family Medicine

## 2022-10-27 ENCOUNTER — Encounter: Payer: Self-pay | Admitting: Family Medicine

## 2022-10-27 VITALS — BP 120/72 | HR 78 | Temp 98.5°F | Ht 64.0 in | Wt 119.6 lb

## 2022-10-27 DIAGNOSIS — R42 Dizziness and giddiness: Secondary | ICD-10-CM | POA: Diagnosis not present

## 2022-10-27 MED ORDER — MECLIZINE HCL 25 MG PO TABS: 25.0000 mg | ORAL_TABLET | Freq: Three times a day (TID) | ORAL | 0 refills | Status: AC | PRN

## 2022-10-27 NOTE — Progress Notes (Signed)
Subjective:    Patient ID: Stacy Day, female    DOB: 11/23/1955, 67 y.o.   MRN: [id number] Patient states that 2 weeks ago she developed sudden onset of dizziness.  She denies the room spinning.  The dizziness was constant and lasted for 2 days.  She felt like there was movement and she felt very off balance.  However there was no specific trigger.  She denied any sudden hearing loss or tinnitus.  She denied any blurry vision or double vision.  She denied any other neurologic deficit such as unilateral arm weakness or leg weakness or slurred speech or facial droop.  She took meclizine and the dizziness went away.  She has been completely asymptomatic for the last 2 weeks.  She is here today just for a checkup.  Cranial nerves II through XII are grossly intact muscle strength 5/5 equal and symmetric in the upper and lower extremities.  Cerebellar exam is normal.  Romberg testing is normal  Current Outpatient Medications on File Prior to Visit  Medication Sig Dispense Refill   ALPRAZolam (XANAX) 0.5 MG tablet alprazolam 0.5 mg tablet  TAKE 1 TABLET BY MOUTH ONCE DAILY AS NEEDED FOR 15 DAYS     Cholecalciferol (VITAMIN D PO) Take 1 tablet by mouth daily.     desonide (DESOWEN) 0.05 % cream Apply topically 2 (two) times daily. 30 g 0   dexamethasone (DECADRON) 0.1 % ophthalmic solution Place 2 drops into both eyes every 4 (four) hours. 5 mL 0   Flaxseed, Linseed, (FLAXSEED OIL PO) Take by mouth.     ibuprofen (ADVIL) 800 MG tablet Take 1 tablet (800 mg total) by mouth every 8 (eight) hours as needed. 30 tablet 0   Multiple Vitamins-Minerals (PRESERVISION AREDS 2+MULTI VIT) CAPS      traMADol (ULTRAM) 50 MG tablet Take 50 mg by mouth every 6 (six) hours as needed.     No current facility-administered medications on file prior to visit.   No Known Allergies Social History   Socioeconomic History   Marital status: Single    Spouse name: Not on file   Number of children: Not on file    Years of education: Not on file   Highest education level: Not on file  Occupational History   Not on file  Tobacco Use   Smoking status: Never   Smokeless tobacco: Never  Substance and Sexual Activity   Alcohol use: Never   Drug use: Never   Sexual activity: Not on file  Other Topics Concern   Not on file  Social History Narrative   Not on file   Social Determinants of Health   Financial Resource Strain: Low Risk  (06/04/2022)   Overall Financial Resource Strain (CARDIA)    Difficulty of Paying Living Expenses: Not hard at all  Food Insecurity: No Food Insecurity (06/04/2022)   Hunger Vital Sign    Worried About Running Out of Food in the Last Year: Never true    Ran Out of Food in the Last Year: Never true  Transportation Needs: No Transportation Needs (06/04/2022)   PRAPARE - Administrator, Civil Service (Medical): No    Lack of Transportation (Non-Medical): No  Physical Activity: Sufficiently Active (06/04/2022)   Exercise Vital Sign    Days of Exercise per Week: 5 days    Minutes of Exercise per Session: 50 min  Stress: No Stress Concern Present (06/04/2022)   [employer] of Occupational Health - Occupational Stress Questionnaire  Feeling of Stress : Not at all  Social Connections: Socially Isolated (06/04/2022)   Social Connection and Isolation Panel [NHANES]    Frequency of Communication with Friends and Family: More than three times a week    Frequency of Social Gatherings with Friends and Family: More than three times a week    Attends Religious Services: Never    Database administrator or Organizations: No    Attends Banker Meetings: Never    Marital Status: Never married  Intimate Partner Violence: Not At Risk (06/04/2022)   Humiliation, Afraid, Rape, and Kick questionnaire    Fear of Current or Ex-Partner: No    Emotionally Abused: No    Physically Abused: No    Sexually Abused: No     Review of Systems  Neurological:   Positive for dizziness.  All other systems reviewed and are negative.      Objective:   Physical Exam Constitutional:      General: She is not in acute distress.    Appearance: Normal appearance. She is normal weight. She is not ill-appearing or toxic-appearing.  HENT:     Nose: No congestion or rhinorrhea.     Mouth/Throat:     Mouth: Mucous membranes are moist.     Pharynx: No oropharyngeal exudate or posterior oropharyngeal erythema.  Eyes:     General: Lids are everted, no foreign bodies appreciated. Vision grossly intact. Gaze aligned appropriately. No visual field deficit or scleral icterus.       Right eye: No foreign body, discharge or hordeolum.        Left eye: No foreign body, discharge or hordeolum.     Conjunctiva/sclera: Conjunctivae normal.     Right eye: Right conjunctiva is not injected. No chemosis or exudate.    Left eye: Left conjunctiva is not injected. No chemosis or exudate. Cardiovascular:     Rate and Rhythm: Normal rate and regular rhythm.     Heart sounds: Normal heart sounds.  Pulmonary:     Effort: Pulmonary effort is normal.  Neurological:     Mental Status: She is alert and oriented to person, place, and time. Mental status is at baseline.     Cranial Nerves: Cranial nerves 2-12 are intact. No cranial nerve deficit, dysarthria or facial asymmetry.     Sensory: Sensation is intact.     Motor: Tremor present. No weakness, atrophy, abnormal muscle tone, seizure activity or pronator drift.     Coordination: Coordination is intact. Romberg sign negative. Coordination normal. Finger-Nose-Finger Test normal. Rapid alternating movements normal.     Gait: Gait is intact.           Assessment & Plan:  Dizziness I believe the patient's symptoms were due to vertigo.  There is no evidence of a stroke.  Symptoms resolved after 48 hours while taking meclizine.  There is no signs or symptoms of Mnire's disease other than the dizziness.  Neurologic exam  today is completely normal aside from a mild essential tremor in her head.  Therefore, no further workup is necessary as long as symptoms do not return.  Patient did have bilateral cerumen impactions today.  We removed this with irrigation and lavage.

## 2022-11-17 DIAGNOSIS — L219 Seborrheic dermatitis, unspecified: Secondary | ICD-10-CM | POA: Diagnosis not present

## 2022-11-17 DIAGNOSIS — H019 Unspecified inflammation of eyelid: Secondary | ICD-10-CM | POA: Diagnosis not present

## 2022-12-29 ENCOUNTER — Other Ambulatory Visit (HOSPITAL_BASED_OUTPATIENT_CLINIC_OR_DEPARTMENT_OTHER): Payer: Self-pay

## 2022-12-29 MED ORDER — FLUAD 0.5 ML IM SUSY
0.5000 mL | PREFILLED_SYRINGE | Freq: Once | INTRAMUSCULAR | 0 refills | Status: AC
Start: 2022-12-29 — End: 2022-12-30
  Filled 2022-12-29: qty 0.5, 1d supply, fill #0

## 2023-01-11 DIAGNOSIS — L578 Other skin changes due to chronic exposure to nonionizing radiation: Secondary | ICD-10-CM | POA: Diagnosis not present

## 2023-01-11 DIAGNOSIS — D225 Melanocytic nevi of trunk: Secondary | ICD-10-CM | POA: Diagnosis not present

## 2023-01-11 DIAGNOSIS — L814 Other melanin hyperpigmentation: Secondary | ICD-10-CM | POA: Diagnosis not present

## 2023-01-11 DIAGNOSIS — L821 Other seborrheic keratosis: Secondary | ICD-10-CM | POA: Diagnosis not present

## 2023-03-04 ENCOUNTER — Other Ambulatory Visit: Payer: Self-pay | Admitting: Obstetrics and Gynecology

## 2023-03-04 DIAGNOSIS — Z0000 Encounter for general adult medical examination without abnormal findings: Secondary | ICD-10-CM

## 2023-04-08 ENCOUNTER — Ambulatory Visit (INDEPENDENT_AMBULATORY_CARE_PROVIDER_SITE_OTHER): Payer: Medicare Other | Admitting: Family Medicine

## 2023-04-08 VITALS — BP 118/62 | HR 81 | Temp 98.1°F | Ht 64.0 in | Wt 120.6 lb

## 2023-04-08 DIAGNOSIS — J320 Chronic maxillary sinusitis: Secondary | ICD-10-CM | POA: Diagnosis not present

## 2023-04-08 MED ORDER — FLUCONAZOLE 150 MG PO TABS: 150.0000 mg | ORAL_TABLET | Freq: Once | ORAL | 0 refills | Status: AC

## 2023-04-08 MED ORDER — AMOXICILLIN-POT CLAVULANATE 875-125 MG PO TABS: 1.0000 | ORAL_TABLET | Freq: Two times a day (BID) | ORAL | 0 refills | Status: DC

## 2023-04-08 NOTE — Progress Notes (Signed)
Subjective:    Patient ID: Stacy Day, female    DOB: 1955/10/18, 68 y.o.   MRN: [id number] Patient is a very pleasant 68 year old Caucasian female who presents today with left maxillary and left frontal sinus pain and pressure.  She states she has been battling an upper respiratory infection for over 6 weeks.  She has been taking Mucinex and Claritin and Coricidin without relief.  She has been trying nasal saline.  Now she has developed pain and pressure in her left maxillary and left frontal sinus.  She has a constant sinus headache.  She has a difficult time breathing through her left nostril. Current Outpatient Medications on File Prior to Visit  Medication Sig Dispense Refill   ALPRAZolam (XANAX) 0.5 MG tablet alprazolam 0.5 mg tablet  TAKE 1 TABLET BY MOUTH ONCE DAILY AS NEEDED FOR 15 DAYS     Cholecalciferol (VITAMIN D PO) Take 1 tablet by mouth daily.     desonide (DESOWEN) 0.05 % cream Apply topically 2 (two) times daily. 30 g 0   dexamethasone (DECADRON) 0.1 % ophthalmic solution Place 2 drops into both eyes every 4 (four) hours. 5 mL 0   Flaxseed, Linseed, (FLAXSEED OIL PO) Take by mouth.     ibuprofen (ADVIL) 800 MG tablet Take 1 tablet (800 mg total) by mouth every 8 (eight) hours as needed. 30 tablet 0   meclizine (ANTIVERT) 25 MG tablet Take 1 tablet (25 mg total) by mouth 3 (three) times daily as needed for dizziness. 30 tablet 0   Multiple Vitamins-Minerals (PRESERVISION AREDS 2+MULTI VIT) CAPS      traMADol (ULTRAM) 50 MG tablet Take 50 mg by mouth every 6 (six) hours as needed.     No current facility-administered medications on file prior to visit.   No Known Allergies Social History   Socioeconomic History   Marital status: Single    Spouse name: Not on file   Number of children: Not on file   Years of education: Not on file   Highest education level: Not on file  Occupational History   Not on file  Tobacco Use   Smoking status: Never   Smokeless  tobacco: Never  Substance and Sexual Activity   Alcohol use: Never   Drug use: Never   Sexual activity: Not on file  Other Topics Concern   Not on file  Social History Narrative   Not on file   Social Drivers of Health   Financial Resource Strain: Low Risk  (06/04/2022)   Overall Financial Resource Strain (CARDIA)    Difficulty of Paying Living Expenses: Not hard at all  Food Insecurity: No Food Insecurity (06/04/2022)   Hunger Vital Sign    Worried About Running Out of Food in the Last Year: Never true    Ran Out of Food in the Last Year: Never true  Transportation Needs: No Transportation Needs (06/04/2022)   PRAPARE - Administrator, Civil Service (Medical): No    Lack of Transportation (Non-Medical): No  Physical Activity: Sufficiently Active (06/04/2022)   Exercise Vital Sign    Days of Exercise per Week: 5 days    Minutes of Exercise per Session: 50 min  Stress: No Stress Concern Present (06/04/2022)   [employer] of Occupational Health - Occupational Stress Questionnaire    Feeling of Stress : Not at all  Social Connections: Socially Isolated (06/04/2022)   Social Connection and Isolation Panel [NHANES]    Frequency of Communication with Friends and  Family: More than three times a week    Frequency of Social Gatherings with Friends and Family: More than three times a week    Attends Religious Services: Never    Database administrator or Organizations: No    Attends Banker Meetings: Never    Marital Status: Never married  Intimate Partner Violence: Not At Risk (06/04/2022)   Humiliation, Afraid, Rape, and Kick questionnaire    Fear of Current or Ex-Partner: No    Emotionally Abused: No    Physically Abused: No    Sexually Abused: No     Review of Systems  Neurological:  Positive for dizziness.  All other systems reviewed and are negative.      Objective:   Physical Exam Constitutional:      General: She is not in acute distress.     Appearance: Normal appearance. She is normal weight. She is not ill-appearing or toxic-appearing.  HENT:     Right Ear: Tympanic membrane and ear canal normal.     Left Ear: Tympanic membrane and ear canal normal.     Nose: Congestion present. No rhinorrhea.     Left Sinus: Maxillary sinus tenderness and frontal sinus tenderness present.     Mouth/Throat:     Mouth: Mucous membranes are moist.     Pharynx: No oropharyngeal exudate or posterior oropharyngeal erythema.  Eyes:     General: Lids are everted, no foreign bodies appreciated. Vision grossly intact. Gaze aligned appropriately. No visual field deficit or scleral icterus.       Right eye: No foreign body, discharge or hordeolum.        Left eye: No foreign body, discharge or hordeolum.     Conjunctiva/sclera: Conjunctivae normal.     Right eye: Right conjunctiva is not injected. No chemosis or exudate.    Left eye: Left conjunctiva is not injected. No chemosis or exudate. Cardiovascular:     Rate and Rhythm: Normal rate and regular rhythm.     Heart sounds: Normal heart sounds.  Pulmonary:     Effort: Pulmonary effort is normal.  Neurological:     Mental Status: She is alert and oriented to person, place, and time. Mental status is at baseline.     Cranial Nerves: Cranial nerves 2-12 are intact. No cranial nerve deficit, dysarthria or facial asymmetry.     Sensory: Sensation is intact.     Motor: Tremor present. No weakness, atrophy, abnormal muscle tone, seizure activity or pronator drift.     Coordination: Coordination is intact. Romberg sign negative. Coordination normal. Finger-Nose-Finger Test normal. Rapid alternating movements normal.     Gait: Gait is intact.           Assessment & Plan:  Left maxillary sinusitis Start the patient on Augmentin 875 mg twice daily for 10 days.  Use Flonase 2 sprays each nostril daily.  Use nasal saline rinses as needed.  Discontinue antihistamine

## 2023-04-15 ENCOUNTER — Ambulatory Visit
Admission: RE | Admit: 2023-04-15 | Discharge: 2023-04-15 | Disposition: A | Discharge status: Home / Self Care | Payer: Medicare Other | Source: Ambulatory Visit | Attending: Obstetrics and Gynecology | Admitting: Obstetrics and Gynecology

## 2023-04-15 DIAGNOSIS — Z0000 Encounter for general adult medical examination without abnormal findings: Secondary | ICD-10-CM

## 2023-04-15 DIAGNOSIS — Z1231 Encounter for screening mammogram for malignant neoplasm of breast: Secondary | ICD-10-CM | POA: Diagnosis not present

## 2023-05-20 DIAGNOSIS — N951 Menopausal and female climacteric states: Secondary | ICD-10-CM | POA: Diagnosis not present

## 2023-05-20 DIAGNOSIS — Z01419 Encounter for gynecological examination (general) (routine) without abnormal findings: Secondary | ICD-10-CM | POA: Diagnosis not present

## 2023-06-04 DIAGNOSIS — K080 Exfoliation of teeth due to systemic causes: Secondary | ICD-10-CM | POA: Diagnosis not present

## 2023-06-08 ENCOUNTER — Ambulatory Visit (INDEPENDENT_AMBULATORY_CARE_PROVIDER_SITE_OTHER): Admitting: Family Medicine

## 2023-06-08 ENCOUNTER — Encounter: Payer: Self-pay | Admitting: Family Medicine

## 2023-06-08 VITALS — BP 116/72 | HR 76 | Temp 98.0°F | Ht 64.0 in | Wt 117.6 lb

## 2023-06-08 DIAGNOSIS — M25562 Pain in left knee: Secondary | ICD-10-CM | POA: Diagnosis not present

## 2023-06-08 MED ORDER — MELOXICAM 15 MG PO TABS: 15.0000 mg | ORAL_TABLET | Freq: Every day | ORAL | 1 refills | Status: AC

## 2023-06-08 NOTE — Progress Notes (Signed)
 Subjective:    Patient ID: Stacy Day, female    DOB: 03-18-1956, 68 y.o.   MRN: [id number]  Patient is a very pleasant 68 year old Caucasian female who presents today with left knee pain.  She estimates that approximately 4 to 5 weeks ago, she was squatting to lift a package.  She felt a pain or a fullness in the left posterior lateral knee.  Since that time she has had pain and discomfort in the posterior lateral left knee especially with squatting.  She states that if she sits for prolonged period of time, she will have significant pain in the left posterior lateral knee.  She denies any laxity to varus or valgus stress.  She denies any locking or instability in the knee with walking.  She denies any effusion.  On exam today, there is no laxity to varus or valgus stress.  She has a negative anterior and posterior drawer sign.  I am unable to elicit any pain with Apley maneuver.  There is not erythema or effusion. Current Outpatient Medications on File Prior to Visit  Medication Sig Dispense Refill   ALPRAZolam (XANAX) 0.5 MG tablet alprazolam 0.5 mg tablet  TAKE 1 TABLET BY MOUTH ONCE DAILY AS NEEDED FOR 15 DAYS     amoxicillin-clavulanate (AUGMENTIN) 875-125 MG tablet Take 1 tablet by mouth 2 (two) times daily. 20 tablet 0   Cholecalciferol (VITAMIN D PO) Take 1 tablet by mouth daily.     desonide (DESOWEN) 0.05 % cream Apply topically 2 (two) times daily. 30 g 0   dexamethasone (DECADRON) 0.1 % ophthalmic solution Place 2 drops into both eyes every 4 (four) hours. 5 mL 0   Flaxseed, Linseed, (FLAXSEED OIL PO) Take by mouth.     ibuprofen (ADVIL) 800 MG tablet Take 1 tablet (800 mg total) by mouth every 8 (eight) hours as needed. 30 tablet 0   meclizine (ANTIVERT) 25 MG tablet Take 1 tablet (25 mg total) by mouth 3 (three) times daily as needed for dizziness. 30 tablet 0   Multiple Vitamins-Minerals (PRESERVISION AREDS 2+MULTI VIT) CAPS      traMADol (ULTRAM) 50 MG tablet Take 50 mg by  mouth every 6 (six) hours as needed.     No current facility-administered medications on file prior to visit.   No Known Allergies Social History   Socioeconomic History   Marital status: Single    Spouse name: Not on file   Number of children: Not on file   Years of education: Not on file   Highest education level: Not on file  Occupational History   Not on file  Tobacco Use   Smoking status: Never   Smokeless tobacco: Never  Substance and Sexual Activity   Alcohol use: Never   Drug use: Never   Sexual activity: Not on file  Other Topics Concern   Not on file  Social History Narrative   Not on file   Social Drivers of Health   Financial Resource Strain: Low Risk  (06/04/2022)   Overall Financial Resource Strain (CARDIA)    Difficulty of Paying Living Expenses: Not hard at all  Food Insecurity: No Food Insecurity (06/04/2022)   Hunger Vital Sign    Worried About Running Out of Food in the Last Year: Never true    Ran Out of Food in the Last Year: Never true  Transportation Needs: No Transportation Needs (06/04/2022)   PRAPARE - Administrator, Civil Service (Medical): No  Lack of Transportation (Non-Medical): No  Physical Activity: Sufficiently Active (06/04/2022)   Exercise Vital Sign    Days of Exercise per Week: 5 days    Minutes of Exercise per Session: 50 min  Stress: No Stress Concern Present (06/04/2022)   [employer] of Occupational Health - Occupational Stress Questionnaire    Feeling of Stress : Not at all  Social Connections: Socially Isolated (06/04/2022)   Social Connection and Isolation Panel [NHANES]    Frequency of Communication with Friends and Family: More than three times a week    Frequency of Social Gatherings with Friends and Family: More than three times a week    Attends Religious Services: Never    Database administrator or Organizations: No    Attends Banker Meetings: Never    Marital Status: Never married   Intimate Partner Violence: Not At Risk (06/04/2022)   Humiliation, Afraid, Rape, and Kick questionnaire    Fear of Current or Ex-Partner: No    Emotionally Abused: No    Physically Abused: No    Sexually Abused: No     Review of Systems  Neurological:  Positive for dizziness.  All other systems reviewed and are negative.      Objective:   Physical Exam Constitutional:      General: She is not in acute distress.    Appearance: Normal appearance. She is normal weight. She is not ill-appearing or toxic-appearing.  HENT:     Nose: No congestion or rhinorrhea.     Mouth/Throat:     Mouth: Mucous membranes are moist.     Pharynx: No oropharyngeal exudate or posterior oropharyngeal erythema.  Eyes:     General: Lids are everted, no foreign bodies appreciated. Vision grossly intact. Gaze aligned appropriately. No visual field deficit or scleral icterus.       Right eye: No foreign body, discharge or hordeolum.        Left eye: No foreign body, discharge or hordeolum.     Conjunctiva/sclera: Conjunctivae normal.     Right eye: Right conjunctiva is not injected. No chemosis or exudate.    Left eye: Left conjunctiva is not injected. No chemosis or exudate. Cardiovascular:     Rate and Rhythm: Normal rate and regular rhythm.     Heart sounds: Normal heart sounds.  Pulmonary:     Effort: Pulmonary effort is normal.  Musculoskeletal:     Left knee: No deformity, effusion, erythema, ecchymosis, bony tenderness or crepitus. Normal range of motion. No tenderness. Normal alignment.       Legs:  Neurological:     Mental Status: She is alert and oriented to person, place, and time. Mental status is at baseline.     Cranial Nerves: Cranial nerves 2-12 are intact. No cranial nerve deficit, dysarthria or facial asymmetry.     Sensory: Sensation is intact.     Motor: Tremor present. No weakness, atrophy, abnormal muscle tone, seizure activity or pronator drift.     Coordination: Coordination  is intact. Romberg sign negative. Coordination normal. Finger-Nose-Finger Test normal. Rapid alternating movements normal.     Gait: Gait is intact.           Assessment & Plan:  Acute pain of left knee Based on her symptoms, I am concerned that she may have damaged the cartilage in the posterior lateral left knee.  This may include a mild meniscal tear.  However her exam today is relatively benign.  I recommended that she wear  a hinged knee brace with activity to provide stability to the knee along with taking meloxicam 15 mg daily.  We will try this for 2 to 3 weeks and see if symptoms are improving.  If not I would proceed with imaging of the knee to evaluate for meniscal tear.  Exam does not support arthritis

## 2023-09-07 ENCOUNTER — Ambulatory Visit: Payer: Self-pay

## 2023-09-07 NOTE — Telephone Encounter (Signed)
 FYI Only or Action Required?: FYI only for provider  Patient was last seen in primary care on 06/08/2023 by Austine Lefort, MD. Called Nurse Triage reporting Sinusitis. Symptoms began several months ago. Interventions attempted: OTC medications: Advil  and Ice/heat application. Symptoms are: left sided sinus pain, runny nose gradually worsening.  Triage Disposition: See PCP When Office is Open (Within 3 Days)  Patient/caregiver understands and will follow disposition?: Yes                   Left sinus, headaches - worsening        Reason for Disposition  [1] Nasal discharge AND [2] present > 10 days  Answer Assessment - Initial Assessment Questions 1. LOCATION: Where does it hurt?      Left side forehead and behind eye sometimes.  2. ONSET: When did the sinus pain start?  (e.g., hours, days)      Couple months.  3. SEVERITY: How bad is the pain?   (Scale 1-10; mild, moderate or severe)   - MILD (1-3): doesn't interfere with normal activities    - MODERATE (4-7): interferes with normal activities (e.g., work or school) or awakens from sleep   - SEVERE (8-10): excruciating pain and patient unable to do any normal activities        Constant 4-5/10.  4. RECURRENT SYMPTOM: Have you ever had sinus problems before? If Yes, ask: When was the last time? and What happened that time?      Yes, back in January and was placed on an antibiotic by PCP.  5. NASAL CONGESTION: Is the nose blocked? If Yes, ask: Can you open it or must you breathe through your mouth?     No.  6. NASAL DISCHARGE: Do you have discharge from your nose? If so ask, What color?     Yes, clear.  7. FEVER: Do you have a fever? If Yes, ask: What is it, how was it measured, and when did it start?      No.  8. OTHER SYMPTOMS: Do you have any other symptoms? (e.g., sore throat, cough, earache, difficulty breathing)     No.  9. PREGNANCY: Is there any chance you are  pregnant? When was your last menstrual period?     N/A.  Protocols used: Sinus Pain or Congestion-A-AH

## 2023-09-09 ENCOUNTER — Encounter: Payer: Self-pay | Admitting: Family Medicine

## 2023-09-09 ENCOUNTER — Ambulatory Visit: Admitting: Family Medicine

## 2023-09-09 VITALS — BP 116/72 | HR 72 | Temp 98.5°F | Ht 64.0 in | Wt 118.5 lb

## 2023-09-09 DIAGNOSIS — J320 Chronic maxillary sinusitis: Secondary | ICD-10-CM | POA: Diagnosis not present

## 2023-09-09 MED ORDER — AMOXICILLIN-POT CLAVULANATE 875-125 MG PO TABS: 1.0000 | ORAL_TABLET | Freq: Two times a day (BID) | ORAL | 0 refills | Status: DC

## 2023-09-09 MED ORDER — PREDNISONE 20 MG PO TABS: ORAL_TABLET | ORAL | 0 refills | Status: DC

## 2023-09-09 MED ORDER — FLUCONAZOLE 150 MG PO TABS: 150.0000 mg | ORAL_TABLET | Freq: Once | ORAL | 0 refills | Status: AC

## 2023-09-09 NOTE — Progress Notes (Signed)
 Subjective:    Patient ID: Stacy Day, female    DOB: 23-Jul-1955, 68 y.o.   MRN: [id number] Patient is a very pleasant 69 year old Caucasian female who presents today with left maxillary and left frontal sinus pain and pressure.  I saw her originally in January.  We treated that sinus infection and the symptoms completely subsided.  However during allergy season she developed pain and pressure in the left frontal and left maxillary and.  She states this been going on for several weeks.  She has been trying Flonase along with antihistamines without any benefit.  She denies any fevers or chills or cough.  She does report sinus pressure headache.   Current Outpatient Medications on File Prior to Visit  Medication Sig Dispense Refill   ALPRAZolam (XANAX) 0.5 MG tablet alprazolam 0.5 mg tablet  TAKE 1 TABLET BY MOUTH ONCE DAILY AS NEEDED FOR 15 DAYS     Cholecalciferol (VITAMIN D PO) Take 1 tablet by mouth daily.     desonide  (DESOWEN ) 0.05 % cream Apply topically 2 (two) times daily. 30 g 0   Flaxseed, Linseed, (FLAXSEED OIL PO) Take by mouth.     ibuprofen  (ADVIL ) 800 MG tablet Take 1 tablet (800 mg total) by mouth every 8 (eight) hours as needed. 30 tablet 0   meclizine  (ANTIVERT ) 25 MG tablet Take 1 tablet (25 mg total) by mouth 3 (three) times daily as needed for dizziness. 30 tablet 0   meloxicam  (MOBIC ) 15 MG tablet Take 1 tablet (15 mg total) by mouth daily. 30 tablet 1   Multiple Vitamins-Minerals (PRESERVISION AREDS 2+MULTI VIT) CAPS      traMADol (ULTRAM) 50 MG tablet Take 50 mg by mouth every 6 (six) hours as needed. (Patient not taking: Reported on 09/09/2023)     No current facility-administered medications on file prior to visit.   No Known Allergies Social History   Socioeconomic History   Marital status: Single    Spouse name: Not on file   Number of children: Not on file   Years of education: Not on file   Highest education level: Not on file  Occupational History    Not on file  Tobacco Use   Smoking status: Never   Smokeless tobacco: Never  Substance and Sexual Activity   Alcohol use: Never   Drug use: Never   Sexual activity: Not on file  Other Topics Concern   Not on file  Social History Narrative   Not on file   Social Drivers of Health   Financial Resource Strain: Low Risk  (06/04/2022)   Overall Financial Resource Strain (CARDIA)    Difficulty of Paying Living Expenses: Not hard at all  Food Insecurity: No Food Insecurity (06/04/2022)   Hunger Vital Sign    Worried About Running Out of Food in the Last Year: Never true    Ran Out of Food in the Last Year: Never true  Transportation Needs: No Transportation Needs (06/04/2022)   PRAPARE - Administrator, Civil Service (Medical): No    Lack of Transportation (Non-Medical): No  Physical Activity: Sufficiently Active (06/04/2022)   Exercise Vital Sign    Days of Exercise per Week: 5 days    Minutes of Exercise per Session: 50 min  Stress: No Stress Concern Present (06/04/2022)   [employer] of Occupational Health - Occupational Stress Questionnaire    Feeling of Stress : Not at all  Social Connections: Socially Isolated (06/04/2022)   Social Connection and  Isolation Panel    Frequency of Communication with Friends and Family: More than three times a week    Frequency of Social Gatherings with Friends and Family: More than three times a week    Attends Religious Services: Never    Database administrator or Organizations: No    Attends Banker Meetings: Never    Marital Status: Never married  Intimate Partner Violence: Not At Risk (06/04/2022)   Humiliation, Afraid, Rape, and Kick questionnaire    Fear of Current or Ex-Partner: No    Emotionally Abused: No    Physically Abused: No    Sexually Abused: No     Review of Systems  Neurological:  Positive for dizziness.  All other systems reviewed and are negative.      Objective:   Physical  Exam Constitutional:      General: She is not in acute distress.    Appearance: Normal appearance. She is normal weight. She is not ill-appearing or toxic-appearing.  HENT:     Right Ear: Tympanic membrane and ear canal normal.     Left Ear: Tympanic membrane and ear canal normal.     Nose: Congestion present. No rhinorrhea.     Left Sinus: Maxillary sinus tenderness and frontal sinus tenderness present.     Mouth/Throat:     Mouth: Mucous membranes are moist.     Pharynx: No oropharyngeal exudate or posterior oropharyngeal erythema.   Eyes:     General: Lids are everted, no foreign bodies appreciated. Vision grossly intact. Gaze aligned appropriately. No visual field deficit or scleral icterus.       Right eye: No foreign body, discharge or hordeolum.        Left eye: No foreign body, discharge or hordeolum.     Conjunctiva/sclera: Conjunctivae normal.     Right eye: Right conjunctiva is not injected. No chemosis or exudate.    Left eye: Left conjunctiva is not injected. No chemosis or exudate.   Cardiovascular:     Rate and Rhythm: Normal rate and regular rhythm.     Heart sounds: Normal heart sounds.  Pulmonary:     Effort: Pulmonary effort is normal.   Neurological:     Mental Status: She is alert and oriented to person, place, and time. Mental status is at baseline.     Cranial Nerves: Cranial nerves 2-12 are intact. No cranial nerve deficit, dysarthria or facial asymmetry.     Sensory: Sensation is intact.     Motor: Tremor present. No weakness, atrophy, abnormal muscle tone, seizure activity or pronator drift.     Coordination: Coordination is intact. Romberg sign negative. Coordination normal. Finger-Nose-Finger Test normal. Rapid alternating movements normal.     Gait: Gait is intact.           Assessment & Plan:  Left maxillary sinusitis  Start the patient on Augmentin  875 mg twice daily for 10 days.  Add prednisone  taper pack.  Continue Flonase.  Recheck in 1  week if no better or sooner if worsening.

## 2023-11-18 ENCOUNTER — Ambulatory Visit (INDEPENDENT_AMBULATORY_CARE_PROVIDER_SITE_OTHER): Admitting: Family Medicine

## 2023-11-18 ENCOUNTER — Encounter: Payer: Self-pay | Admitting: Family Medicine

## 2023-11-18 VITALS — BP 122/70 | HR 75 | Temp 98.1°F | Ht 64.0 in | Wt 117.4 lb

## 2023-11-18 DIAGNOSIS — J329 Chronic sinusitis, unspecified: Secondary | ICD-10-CM | POA: Diagnosis not present

## 2023-11-18 NOTE — Progress Notes (Signed)
 Subjective:    Patient ID: Stacy Day, female    DOB: August 26, 1955, 68 y.o.   MRN: [id number] 09/09/23 Patient is a very pleasant 68 year old Caucasian female who presents today with left maxillary and left frontal sinus pain and pressure.  I saw her originally in January.  We treated that sinus infection and the symptoms completely subsided.  However during allergy season she developed pain and pressure in the left frontal and left maxillary and.  She states this been going on for several weeks.  She has been trying Flonase along with antihistamines without any benefit.  She denies any fevers or chills or cough.  She does report sinus pressure headache.  At that time, my plan tjd:Dujmu the patient on Augmentin  875 mg twice daily for 10 days.  Add prednisone  taper pack.  Continue Flonase.  Recheck in 1 week if no better or sooner if worsening.  11/18/23 Patient continues to have intermittent pain and pressure in her left frontal and left maxillary sinus.  She states that during the summer when it was hot and humid she would have pressure and pain in those areas.  It would last several hours and then go away after taking some ibuprofen  however it would occur almost on a daily basis.  This has improved as the weather has changed.  She is using Flonase and nasal saline which also seems to help.  She denies any fevers or chills.  There is no tenderness to percussion over her maxillary or frontal sinus.  There is no obstruction of her left nostril.  She states that the symptoms come and go.  She denies any photophobia or phonophobia or nausea or vomiting or severe headaches Current Outpatient Medications on File Prior to Visit  Medication Sig Dispense Refill   ALPRAZolam (XANAX) 0.5 MG tablet alprazolam 0.5 mg tablet  TAKE 1 TABLET BY MOUTH ONCE DAILY AS NEEDED FOR 15 DAYS     Cholecalciferol (VITAMIN D PO) Take 1 tablet by mouth daily.     desonide  (DESOWEN ) 0.05 % cream Apply topically 2 (two) times  daily. 30 g 0   Flaxseed, Linseed, (FLAXSEED OIL PO) Take by mouth.     ibuprofen  (ADVIL ) 800 MG tablet Take 1 tablet (800 mg total) by mouth every 8 (eight) hours as needed. 30 tablet 0   meclizine  (ANTIVERT ) 25 MG tablet Take 1 tablet (25 mg total) by mouth 3 (three) times daily as needed for dizziness. 30 tablet 0   meloxicam  (MOBIC ) 15 MG tablet Take 1 tablet (15 mg total) by mouth daily. 30 tablet 1   Multiple Vitamins-Minerals (PRESERVISION AREDS 2+MULTI VIT) CAPS      amoxicillin -clavulanate (AUGMENTIN ) 875-125 MG tablet Take 1 tablet by mouth 2 (two) times daily. (Patient not taking: Reported on 11/18/2023) 20 tablet 0   predniSONE  (DELTASONE ) 20 MG tablet 3 tabs poqday 1-2, 2 tabs poqday 3-4, 1 tab poqday 5-6 (Patient not taking: Reported on 11/18/2023) 12 tablet 0   traMADol (ULTRAM) 50 MG tablet Take 50 mg by mouth every 6 (six) hours as needed. (Patient not taking: Reported on 11/18/2023)     No current facility-administered medications on file prior to visit.   No Known Allergies Social History   Socioeconomic History   Marital status: Single    Spouse name: Not on file   Number of children: Not on file   Years of education: Not on file   Highest education level: Not on file  Occupational History   Not  on file  Tobacco Use   Smoking status: Never   Smokeless tobacco: Never  Substance and Sexual Activity   Alcohol use: Never   Drug use: Never   Sexual activity: Not on file  Other Topics Concern   Not on file  Social History Narrative   Not on file   Social Drivers of Health   Financial Resource Strain: Low Risk  (06/04/2022)   Overall Financial Resource Strain (CARDIA)    Difficulty of Paying Living Expenses: Not hard at all  Food Insecurity: No Food Insecurity (06/04/2022)   Hunger Vital Sign    Worried About Running Out of Food in the Last Year: Never true    Ran Out of Food in the Last Year: Never true  Transportation Needs: No Transportation Needs (06/04/2022)    PRAPARE - Administrator, Civil Service (Medical): No    Lack of Transportation (Non-Medical): No  Physical Activity: Sufficiently Active (06/04/2022)   Exercise Vital Sign    Days of Exercise per Week: 5 days    Minutes of Exercise per Session: 50 min  Stress: No Stress Concern Present (06/04/2022)   [employer] of Occupational Health - Occupational Stress Questionnaire    Feeling of Stress : Not at all  Social Connections: Socially Isolated (06/04/2022)   Social Connection and Isolation Panel    Frequency of Communication with Friends and Family: More than three times a week    Frequency of Social Gatherings with Friends and Family: More than three times a week    Attends Religious Services: Never    Database administrator or Organizations: No    Attends Banker Meetings: Never    Marital Status: Never married  Intimate Partner Violence: Not At Risk (06/04/2022)   Humiliation, Afraid, Rape, and Kick questionnaire    Fear of Current or Ex-Partner: No    Emotionally Abused: No    Physically Abused: No    Sexually Abused: No     Review of Systems  All other systems reviewed and are negative.      Objective:   Physical Exam Constitutional:      General: She is not in acute distress.    Appearance: Normal appearance. She is normal weight. She is not ill-appearing or toxic-appearing.  HENT:     Right Ear: Tympanic membrane and ear canal normal.     Left Ear: Tympanic membrane and ear canal normal. There is impacted cerumen.     Nose: No congestion or rhinorrhea.     Right Sinus: No maxillary sinus tenderness or frontal sinus tenderness.     Left Sinus: No maxillary sinus tenderness or frontal sinus tenderness.     Mouth/Throat:     Mouth: Mucous membranes are moist.     Pharynx: No oropharyngeal exudate or posterior oropharyngeal erythema.  Eyes:     General: Lids are everted, no foreign bodies appreciated. Vision grossly intact. Gaze aligned  appropriately. No visual field deficit or scleral icterus.       Right eye: No foreign body, discharge or hordeolum.        Left eye: No foreign body, discharge or hordeolum.     Conjunctiva/sclera: Conjunctivae normal.     Right eye: Right conjunctiva is not injected. No chemosis or exudate.    Left eye: Left conjunctiva is not injected. No chemosis or exudate. Cardiovascular:     Rate and Rhythm: Normal rate and regular rhythm.     Heart sounds: Normal heart  sounds.  Pulmonary:     Effort: Pulmonary effort is normal.  Neurological:     Mental Status: She is alert and oriented to person, place, and time. Mental status is at baseline.     Cranial Nerves: Cranial nerves 2-12 are intact. No cranial nerve deficit, dysarthria or facial asymmetry.     Sensory: Sensation is intact.     Motor: Tremor present. No weakness, atrophy, abnormal muscle tone, seizure activity or pronator drift.     Coordination: Coordination is intact. Romberg sign negative. Coordination normal. Finger-Nose-Finger Test normal. Rapid alternating movements normal.     Gait: Gait is intact.           Assessment & Plan:  Chronic rhinosinusitis Patient states that she is doing better since using Flonase and nasal saline.  However I recommended supplementing with Claritin to try to prevent the production of mucus in the first place.  No evidence of any lingering sinus infection at the present time.  Consider CT of the sinuses if symptoms persist or become constant

## 2023-12-01 DIAGNOSIS — W010XXA Fall on same level from slipping, tripping and stumbling without subsequent striking against object, initial encounter: Secondary | ICD-10-CM | POA: Diagnosis not present

## 2023-12-01 DIAGNOSIS — S52002A Unspecified fracture of upper end of left ulna, initial encounter for closed fracture: Secondary | ICD-10-CM | POA: Diagnosis not present

## 2023-12-01 DIAGNOSIS — S53005D Unspecified dislocation of left radial head, subsequent encounter: Secondary | ICD-10-CM | POA: Diagnosis not present

## 2023-12-01 DIAGNOSIS — S53005A Unspecified dislocation of left radial head, initial encounter: Secondary | ICD-10-CM | POA: Diagnosis not present

## 2023-12-01 DIAGNOSIS — S59902A Unspecified injury of left elbow, initial encounter: Secondary | ICD-10-CM | POA: Diagnosis not present

## 2023-12-07 DIAGNOSIS — S46011D Strain of muscle(s) and tendon(s) of the rotator cuff of right shoulder, subsequent encounter: Secondary | ICD-10-CM | POA: Diagnosis not present

## 2023-12-07 DIAGNOSIS — M25522 Pain in left elbow: Secondary | ICD-10-CM | POA: Diagnosis not present

## 2023-12-13 DIAGNOSIS — S53105A Unspecified dislocation of left ulnohumeral joint, initial encounter: Secondary | ICD-10-CM | POA: Diagnosis not present

## 2023-12-13 DIAGNOSIS — G8918 Other acute postprocedural pain: Secondary | ICD-10-CM | POA: Diagnosis not present

## 2023-12-13 DIAGNOSIS — Y999 Unspecified external cause status: Secondary | ICD-10-CM | POA: Diagnosis not present

## 2023-12-13 DIAGNOSIS — X58XXXA Exposure to other specified factors, initial encounter: Secondary | ICD-10-CM | POA: Diagnosis not present

## 2023-12-13 DIAGNOSIS — S52022A Displaced fracture of olecranon process without intraarticular extension of left ulna, initial encounter for closed fracture: Secondary | ICD-10-CM | POA: Diagnosis not present

## 2023-12-13 DIAGNOSIS — S52272A Monteggia's fracture of left ulna, initial encounter for closed fracture: Secondary | ICD-10-CM | POA: Diagnosis not present

## 2023-12-21 DIAGNOSIS — S52022D Displaced fracture of olecranon process without intraarticular extension of left ulna, subsequent encounter for closed fracture with routine healing: Secondary | ICD-10-CM | POA: Diagnosis not present

## 2023-12-31 DIAGNOSIS — S52002D Unspecified fracture of upper end of left ulna, subsequent encounter for closed fracture with routine healing: Secondary | ICD-10-CM | POA: Diagnosis not present

## 2024-01-05 ENCOUNTER — Ambulatory Visit

## 2024-01-05 VITALS — Ht 64.0 in | Wt 117.0 lb

## 2024-01-05 DIAGNOSIS — Z0000 Encounter for general adult medical examination without abnormal findings: Secondary | ICD-10-CM

## 2024-01-05 NOTE — Patient Instructions (Signed)
 Stacy Day,  Thank you for taking the time for your Medicare Wellness Visit. I appreciate your continued commitment to your health goals. Please review the care plan we discussed, and feel free to reach out if I can assist you further.  Medicare recommends these wellness visits once per year to help you and your care team stay ahead of potential health issues. These visits are designed to focus on prevention, allowing your provider to concentrate on managing your acute and chronic conditions during your regular appointments.  Please note that Annual Wellness Visits do not include a physical exam. Some assessments may be limited, especially if the visit was conducted virtually. If needed, we may recommend a separate in-person follow-up with your provider.  Ongoing Care Seeing your primary care provider every 3 to 6 months helps us  monitor your health and provide consistent, personalized care.   Referrals If a referral was made during today's visit and you haven't received any updates within two weeks, please contact the referred provider directly to check on the status.  Recommended Screenings:  Health Maintenance  Topic Date Due   Hepatitis C Screening  Never done   DEXA scan (bone density measurement)  Never done   Flu Shot  10/22/2023   COVID-19 Vaccine (5 - 2025-26 season) 11/22/2023   Medicare Annual Wellness Visit  01/04/2025   Breast Cancer Screening  04/14/2025   DTaP/Tdap/Td vaccine (2 - Td or Tdap) 09/29/2026   Colon Cancer Screening  10/16/2031   Pneumococcal Vaccine for age over 38  Completed   Zoster (Shingles) Vaccine  Completed   Meningitis B Vaccine  Aged Out       01/05/2024   10:53 AM  Advanced Directives  Does Patient Have a Medical Advance Directive? No  Would patient like information on creating a medical advance directive? Yes (MAU/Ambulatory/Procedural Areas - Information given)   Advance Care Planning is important because it: Ensures you receive medical  care that aligns with your values, goals, and preferences. Provides guidance to your family and loved ones, reducing the emotional burden of decision-making during critical moments.  Information on Advanced Care Planning can be found at [place]  Secretary of [facility] Advance Health Care Directives Advance Health Care Directives ([email or web address]   Vision: Annual vision screenings are recommended for early detection of glaucoma, cataracts, and diabetic retinopathy. These exams can also reveal signs of chronic conditions such as diabetes and high blood pressure.  Dental: Annual dental screenings help detect early signs of oral cancer, gum disease, and other conditions linked to overall health, including heart disease and diabetes.  Please see the attached documents for additional preventive care recommendations.

## 2024-01-05 NOTE — Progress Notes (Signed)
 Subjective:   Stacy Day is a 68 y.o. who presents for a Medicare Wellness preventive visit.  As a reminder, Annual Wellness Visits don't include a physical exam, and some assessments may be limited, especially if this visit is performed virtually. We may recommend an in-person follow-up visit with your provider if needed.  Visit Complete: Virtual I connected with  Stacy Day on 01/05/24 by a audio enabled telemedicine application and verified that I am speaking with the correct person using two identifiers.  Patient Location: Home  Provider Location: Home Office  I discussed the limitations of evaluation and management by telemedicine. The patient expressed understanding and agreed to proceed.  Vital Signs: Because this visit was a virtual/telehealth visit, some criteria may be missing or patient reported. Any vitals not documented were not able to be obtained and vitals that have been documented are patient reported.  VideoDeclined- This patient declined Librarian, academic. Therefore the visit was completed with audio only.  Persons Participating in Visit: Patient.  AWV Questionnaire: No: Patient Medicare AWV questionnaire was not completed prior to this visit.  Cardiac Risk Factors include: advanced age (>33men, >9 women)     Objective:    Today's Vitals   01/05/24 1044  Weight: 117 lb (53.1 kg)  Height: 5' 4 (1.626 m)   Body mass index is 20.08 kg/m.     01/05/2024   10:53 AM 06/04/2022   11:30 AM 05/29/2021   11:25 AM  Advanced Directives  Does Patient Have a Medical Advance Directive? No Yes Yes  Type of Advance Directive  Living will;Healthcare Power of [employer] Power of [place];Living will  Does patient want to make changes to medical advance directive?  No - Patient declined   Copy of Healthcare Power of Attorney in Chart?  No - copy requested No - copy requested  Would patient like information on creating  a medical advance directive? Yes (MAU/Ambulatory/Procedural Areas - Information given)      Current Medications (verified) Outpatient Encounter Medications as of 01/05/2024  Medication Sig   ALPRAZolam (XANAX) 0.5 MG tablet alprazolam 0.5 mg tablet  TAKE 1 TABLET BY MOUTH ONCE DAILY AS NEEDED FOR 15 DAYS   celecoxib (CELEBREX) 100 MG capsule Take 100 mg by mouth 2 (two) times daily.   Cholecalciferol (VITAMIN D PO) Take 1 tablet by mouth daily.   desonide  (DESOWEN ) 0.05 % cream Apply topically 2 (two) times daily.   Flaxseed, Linseed, (FLAXSEED OIL PO) Take by mouth.   ibuprofen  (ADVIL ) 800 MG tablet Take 1 tablet (800 mg total) by mouth every 8 (eight) hours as needed.   meclizine  (ANTIVERT ) 25 MG tablet Take 1 tablet (25 mg total) by mouth 3 (three) times daily as needed for dizziness.   Multiple Vitamins-Minerals (PRESERVISION AREDS 2+MULTI VIT) CAPS    amoxicillin -clavulanate (AUGMENTIN ) 875-125 MG tablet Take 1 tablet by mouth 2 (two) times daily. (Patient not taking: Reported on 01/05/2024)   meloxicam  (MOBIC ) 15 MG tablet Take 1 tablet (15 mg total) by mouth daily. (Patient not taking: Reported on 01/05/2024)   predniSONE  (DELTASONE ) 20 MG tablet 3 tabs poqday 1-2, 2 tabs poqday 3-4, 1 tab poqday 5-6 (Patient not taking: Reported on 01/05/2024)   traMADol (ULTRAM) 50 MG tablet Take 50 mg by mouth every 6 (six) hours as needed. (Patient not taking: Reported on 01/05/2024)   No facility-administered encounter medications on file as of 01/05/2024.    Allergies (verified) Patient has no known allergies.  History: History reviewed. No pertinent past medical history. History reviewed. No pertinent surgical history. Family History  Problem Relation Age of Onset   Breast cancer Paternal Aunt    Breast cancer Cousin    Social History   Socioeconomic History   Marital status: Single    Spouse name: Not on file   Number of children: Not on file   Years of education: Not on file    Highest education level: Not on file  Occupational History   Not on file  Tobacco Use   Smoking status: Never   Smokeless tobacco: Never  Substance and Sexual Activity   Alcohol use: Never   Drug use: Never   Sexual activity: Not on file  Other Topics Concern   Not on file  Social History Narrative   Not on file   Social Drivers of Health   Financial Resource Strain: Low Risk  (01/05/2024)   Overall Financial Resource Strain (CARDIA)    Difficulty of Paying Living Expenses: Not hard at all  Food Insecurity: No Food Insecurity (01/05/2024)   Hunger Vital Sign    Worried About Running Out of Food in the Last Year: Never true    Ran Out of Food in the Last Year: Never true  Transportation Needs: No Transportation Needs (01/05/2024)   PRAPARE - Administrator, Civil Service (Medical): No    Lack of Transportation (Non-Medical): No  Physical Activity: Insufficiently Active (01/05/2024)   Exercise Vital Sign    Days of Exercise per Week: 3 days    Minutes of Exercise per Session: 30 min  Stress: No Stress Concern Present (01/05/2024)   [employer] of Occupational Health - Occupational Stress Questionnaire    Feeling of Stress: Not at all  Social Connections: Socially Isolated (01/05/2024)   Social Connection and Isolation Panel    Frequency of Communication with Friends and Family: More than three times a week    Frequency of Social Gatherings with Friends and Family: More than three times a week    Attends Religious Services: Never    Database administrator or Organizations: No    Attends Engineer, structural: Never    Marital Status: Never married    Tobacco Counseling Counseling given: Not Answered    Clinical Intake:  Pre-visit preparation completed: Yes  Pain : No/denies pain  Diabetes: No  How often do you need to have someone help you when you read instructions, pamphlets, or other written materials from your doctor or pharmacy?:  1 - Never  Interpreter Needed?: No  Information entered by :: Stacy Bloodgood LPN   Activities of Daily Living     01/05/2024   10:53 AM 06/04/2023    4:30 PM  In your present state of health, do you have any difficulty performing the following activities:  Hearing? 0 0  Vision? 0 0  Difficulty concentrating or making decisions? 0 0  Walking or climbing stairs? 0 0  Dressing or bathing? 0 0  Doing errands, shopping? 0 0  Preparing Food and eating ? N N  Using the Toilet? N N  In the past six months, have you accidently leaked urine? N N  Do you have problems with loss of bowel control? N N  Managing your Medications? N N  Managing your Finances? N N  Housekeeping or managing your Housekeeping? N N    Patient Care Team: Duanne Butler DASEN, MD as PCP - General (Family Medicine) Patrcia Sharper, MD as  Consulting Physician (Ophthalmology) Tricia, Tawni CROME, MD as Referring Physician (Dermatology) Kristie Lamprey, MD as Consulting Physician (Gastroenterology) Estelle Service, MD as Consulting Physician (Obstetrics and Gynecology) Cristy Bonner DASEN, MD as Consulting Physician (Orthopedic Surgery)  I have updated your Care Teams any recent Medical Services you may have received from other providers in the past year.     Assessment:   This is a routine wellness examination for Stacy Day.  Hearing/Vision screen Hearing Screening - Comments:: Patient is able to hear conversational tones without difficulty. No issues reported.   Vision Screening - Comments:: Wears rx glasses - up to date with routine eye exams with Dr. Patrcia    Goals Addressed             This Visit's Progress    Remain active and independent   On track      Depression Screen     01/05/2024   10:51 AM 06/04/2022   11:26 AM 05/01/2022   10:58 AM 05/29/2021   11:23 AM  PHQ 2/9 Scores  PHQ - 2 Score 0 0 0 0    Fall Risk     01/05/2024   10:52 AM 06/04/2023    4:30 PM 06/04/2022   11:25 AM 06/03/2022     5:39 PM 05/01/2022   10:58 AM  Fall Risk   Falls in the past year? 1 0 0 0 0  Number falls in past yr: 0 0 0 0 0  Injury with Fall? 1 0 0 0 0  Risk for fall due to : History of fall(s)  No Fall Risks  No Fall Risks  Follow up Education provided;Falls prevention discussed;Falls evaluation completed  Falls prevention discussed;Education provided;Falls evaluation completed  Falls prevention discussed    MEDICARE RISK AT HOME:  Medicare Risk at Home Any stairs in or around the home?: No If so, are there any without handrails?: No Home free of loose throw rugs in walkways, pet beds, electrical cords, etc?: Yes Adequate lighting in your home to reduce risk of falls?: Yes Life alert?: No Use of a cane, walker or w/c?: No Grab bars in the bathroom?: Yes Shower chair or bench in shower?: No Elevated toilet seat or a handicapped toilet?: No  TIMED UP AND GO:  Was the test performed?  No  Cognitive Function: 6CIT completed        01/05/2024   10:53 AM 06/04/2022   11:30 AM  6CIT Screen  What Year? 0 points 0 points  What month? 0 points 0 points  What time? 0 points 0 points  Count back from 20 0 points 0 points  Months in reverse 0 points 0 points  Repeat phrase 0 points 0 points  Total Score 0 points 0 points    Immunizations Immunization History  Administered Date(s) Administered   Fluad  Quad(high Dose 65+) 01/25/2020, 12/15/2021   Fluad  Trivalent(High Dose 65+) 12/29/2022   Influenza,inj,Quad PF,6+ Mos 01/05/2013, 01/18/2014, 01/10/2015, 01/28/2016, 01/05/2017, 12/24/2017, 12/30/2018   Influenza-Unspecified 12/27/2008, 01/16/2010, 01/28/2011, 01/13/2012   PFIZER(Purple Top)SARS-COV-2 Vaccination 05/26/2019, 06/21/2019, 01/04/2020   PNEUMOCOCCAL CONJUGATE-20 12/11/2020   Pfizer Covid-19 Vaccine Bivalent Booster 31yrs & up 01/27/2021   Tdap 09/28/2016   Zoster Recombinant(Shingrix) 10/20/2019, 12/21/2019    Screening Tests Health Maintenance  Topic Date Due   Hepatitis  C Screening  Never done   DEXA SCAN  Never done   Influenza Vaccine  10/22/2023   COVID-19 Vaccine (5 - 2025-26 season) 11/22/2023   Medicare Annual Wellness (AWV)  01/04/2025  Mammogram  04/14/2025   DTaP/Tdap/Td (2 - Td or Tdap) 09/29/2026   Colonoscopy  10/16/2031   Pneumococcal Vaccine: 50+ Years  Completed   Zoster Vaccines- Shingrix  Completed   Meningococcal B Vaccine  Aged Out    Health Maintenance Items Addressed: Vaccines Due: Flu Patient declines dexa  Additional Screening:  Vision Screening: Recommended annual ophthalmology exams for early detection of glaucoma and other disorders of the eye. Is the patient up to date with their annual eye exam?  Yes  Who is the provider or what is the name of the office in which the patient attends annual eye exams? Dr. Patrcia   Dental Screening: Recommended annual dental exams for proper oral hygiene  Community Resource Referral / Chronic Care Management: CRR required this visit?  No   CCM required this visit?  No   Plan:    I have personally reviewed and noted the following in the patient's chart:   Medical and social history Use of alcohol, tobacco or illicit drugs  Current medications and supplements including opioid prescriptions. Patient is not currently taking opioid prescriptions. Functional ability and status Nutritional status Physical activity Advanced directives List of other physicians Hospitalizations, surgeries, and ER visits in previous 12 months Vitals Screenings to include cognitive, depression, and falls Referrals and appointments  In addition, I have reviewed and discussed with patient certain preventive protocols, quality metrics, and best practice recommendations. A written personalized care plan for preventive services as well as general preventive health recommendations were provided to patient.   Lavelle Pfeiffer [place], [place]   [id number]/84/7974   After Visit Summary: (MyChart) Due to this being a  telephonic visit, the after visit summary with patients personalized plan was offered to patient via MyChart   Notes: Nothing significant to report at this time.

## 2024-01-10 ENCOUNTER — Other Ambulatory Visit (INDEPENDENT_AMBULATORY_CARE_PROVIDER_SITE_OTHER)

## 2024-01-10 DIAGNOSIS — M25622 Stiffness of left elbow, not elsewhere classified: Secondary | ICD-10-CM | POA: Diagnosis not present

## 2024-01-10 DIAGNOSIS — M6281 Muscle weakness (generalized): Secondary | ICD-10-CM | POA: Diagnosis not present

## 2024-01-10 DIAGNOSIS — Z23 Encounter for immunization: Secondary | ICD-10-CM | POA: Diagnosis not present

## 2024-01-10 DIAGNOSIS — S52002D Unspecified fracture of upper end of left ulna, subsequent encounter for closed fracture with routine healing: Secondary | ICD-10-CM | POA: Diagnosis not present

## 2024-01-10 NOTE — Progress Notes (Signed)
 Patient is in office today for a nurse visit for Immunization. Patient Injection was given in the  Right deltoid. Patient tolerated injection well.

## 2024-01-19 DIAGNOSIS — M6281 Muscle weakness (generalized): Secondary | ICD-10-CM | POA: Diagnosis not present

## 2024-01-19 DIAGNOSIS — S52002D Unspecified fracture of upper end of left ulna, subsequent encounter for closed fracture with routine healing: Secondary | ICD-10-CM | POA: Diagnosis not present

## 2024-01-19 DIAGNOSIS — M25622 Stiffness of left elbow, not elsewhere classified: Secondary | ICD-10-CM | POA: Diagnosis not present

## 2024-01-28 DIAGNOSIS — M25622 Stiffness of left elbow, not elsewhere classified: Secondary | ICD-10-CM | POA: Diagnosis not present

## 2024-01-28 DIAGNOSIS — M6281 Muscle weakness (generalized): Secondary | ICD-10-CM | POA: Diagnosis not present

## 2024-01-28 DIAGNOSIS — S52002D Unspecified fracture of upper end of left ulna, subsequent encounter for closed fracture with routine healing: Secondary | ICD-10-CM | POA: Diagnosis not present

## 2024-02-01 DIAGNOSIS — L578 Other skin changes due to chronic exposure to nonionizing radiation: Secondary | ICD-10-CM | POA: Diagnosis not present

## 2024-02-01 DIAGNOSIS — D225 Melanocytic nevi of trunk: Secondary | ICD-10-CM | POA: Diagnosis not present

## 2024-02-01 DIAGNOSIS — L821 Other seborrheic keratosis: Secondary | ICD-10-CM | POA: Diagnosis not present

## 2024-02-01 DIAGNOSIS — L814 Other melanin hyperpigmentation: Secondary | ICD-10-CM | POA: Diagnosis not present

## 2024-02-02 DIAGNOSIS — M6281 Muscle weakness (generalized): Secondary | ICD-10-CM | POA: Diagnosis not present

## 2024-02-02 DIAGNOSIS — S52002D Unspecified fracture of upper end of left ulna, subsequent encounter for closed fracture with routine healing: Secondary | ICD-10-CM | POA: Diagnosis not present

## 2024-02-02 DIAGNOSIS — M25622 Stiffness of left elbow, not elsewhere classified: Secondary | ICD-10-CM | POA: Diagnosis not present

## 2024-02-04 DIAGNOSIS — S52002D Unspecified fracture of upper end of left ulna, subsequent encounter for closed fracture with routine healing: Secondary | ICD-10-CM | POA: Diagnosis not present

## 2024-02-07 DIAGNOSIS — M25622 Stiffness of left elbow, not elsewhere classified: Secondary | ICD-10-CM | POA: Diagnosis not present

## 2024-02-07 DIAGNOSIS — S52002D Unspecified fracture of upper end of left ulna, subsequent encounter for closed fracture with routine healing: Secondary | ICD-10-CM | POA: Diagnosis not present

## 2024-02-07 DIAGNOSIS — M6281 Muscle weakness (generalized): Secondary | ICD-10-CM | POA: Diagnosis not present

## 2024-02-29 DIAGNOSIS — S52002D Unspecified fracture of upper end of left ulna, subsequent encounter for closed fracture with routine healing: Secondary | ICD-10-CM | POA: Diagnosis not present

## 2024-02-29 DIAGNOSIS — M25622 Stiffness of left elbow, not elsewhere classified: Secondary | ICD-10-CM | POA: Diagnosis not present

## 2024-02-29 DIAGNOSIS — M6281 Muscle weakness (generalized): Secondary | ICD-10-CM | POA: Diagnosis not present

## 2024-03-03 ENCOUNTER — Other Ambulatory Visit: Payer: Self-pay | Admitting: Obstetrics and Gynecology

## 2024-03-03 DIAGNOSIS — Z1231 Encounter for screening mammogram for malignant neoplasm of breast: Secondary | ICD-10-CM

## 2024-03-03 DIAGNOSIS — M25622 Stiffness of left elbow, not elsewhere classified: Secondary | ICD-10-CM | POA: Diagnosis not present

## 2024-04-04 ENCOUNTER — Encounter: Payer: Self-pay | Admitting: Family Medicine

## 2024-04-04 ENCOUNTER — Ambulatory Visit: Admitting: Family Medicine

## 2024-04-04 VITALS — BP 122/76 | HR 71 | Temp 98.0°F | Ht 64.0 in | Wt 120.0 lb

## 2024-04-04 DIAGNOSIS — J320 Chronic maxillary sinusitis: Secondary | ICD-10-CM | POA: Diagnosis not present

## 2024-04-04 MED ORDER — AMOXICILLIN-POT CLAVULANATE 875-125 MG PO TABS: 1.0000 | ORAL_TABLET | Freq: Two times a day (BID) | ORAL | 0 refills | Status: AC

## 2024-04-04 MED ORDER — PREDNISONE 20 MG PO TABS: ORAL_TABLET | ORAL | 0 refills | Status: AC

## 2024-04-04 NOTE — Progress Notes (Signed)
 "  Subjective:    Patient ID: Stacy Day, female    DOB: 01/09/1956, 70 y.o.   MRN: [id number] Patient is a very pleasant 69 year old Caucasian female who presents today with left maxillary and left frontal sinus pain and pressure.  Patient deals with sinus infections often throughout the year.  The last time I saw her was in August.  She has been using an antihistamine and Flonase to help prevent this and this has worked relatively well until recently.  Symptoms have been present for several weeks.  She also complains of pain and pressure in her right ear.  She complains of pain and pressure in her left forehead and into her left maxillary sinus.  She reports constant rhinorrhea Current Outpatient Medications on File Prior to Visit  Medication Sig Dispense Refill   ALPRAZolam (XANAX) 0.5 MG tablet alprazolam 0.5 mg tablet  TAKE 1 TABLET BY MOUTH ONCE DAILY AS NEEDED FOR 15 DAYS     celecoxib (CELEBREX) 100 MG capsule Take 100 mg by mouth 2 (two) times daily.     Cholecalciferol (VITAMIN D PO) Take 1 tablet by mouth daily.     desonide  (DESOWEN ) 0.05 % cream Apply topically 2 (two) times daily. 30 g 0   Flaxseed, Linseed, (FLAXSEED OIL PO) Take by mouth.     ibuprofen  (ADVIL ) 800 MG tablet Take 1 tablet (800 mg total) by mouth every 8 (eight) hours as needed. 30 tablet 0   meclizine  (ANTIVERT ) 25 MG tablet Take 1 tablet (25 mg total) by mouth 3 (three) times daily as needed for dizziness. 30 tablet 0   Multiple Vitamins-Minerals (PRESERVISION AREDS 2+MULTI VIT) CAPS      meloxicam  (MOBIC ) 15 MG tablet Take 1 tablet (15 mg total) by mouth daily. (Patient not taking: Reported on 04/04/2024) 30 tablet 1   traMADol (ULTRAM) 50 MG tablet Take 50 mg by mouth every 6 (six) hours as needed. (Patient not taking: Reported on 04/04/2024)     No current facility-administered medications on file prior to visit.   No Known Allergies Social History   Socioeconomic History   Marital status: Single     Spouse name: Not on file   Number of children: Not on file   Years of education: Not on file   Highest education level: Not on file  Occupational History   Not on file  Tobacco Use   Smoking status: Never   Smokeless tobacco: Never  Substance and Sexual Activity   Alcohol use: Never   Drug use: Never   Sexual activity: Not on file  Other Topics Concern   Not on file  Social History Narrative   Not on file   Social Drivers of Health   Tobacco Use: Low Risk (04/04/2024)   Patient History    Smoking Tobacco Use: Never    Smokeless Tobacco Use: Never    Passive Exposure: Not on file  Financial Resource Strain: Low Risk (01/05/2024)   Overall Financial Resource Strain (CARDIA)    Difficulty of Paying Living Expenses: Not hard at all  Food Insecurity: No Food Insecurity (01/05/2024)   Epic    Worried About Programme Researcher, Broadcasting/film/video in the Last Year: Never true    Ran Out of Food in the Last Year: Never true  Transportation Needs: No Transportation Needs (01/05/2024)   Epic    Lack of Transportation (Medical): No    Lack of Transportation (Non-Medical): No  Physical Activity: Insufficiently Active (01/05/2024)   Exercise Vital Sign  Days of Exercise per Week: 3 days    Minutes of Exercise per Session: 30 min  Stress: No Stress Concern Present (01/05/2024)   [employer] of Occupational Health - Occupational Stress Questionnaire    Feeling of Stress: Not at all  Social Connections: Socially Isolated (01/05/2024)   Social Connection and Isolation Panel    Frequency of Communication with Friends and Family: More than three times a week    Frequency of Social Gatherings with Friends and Family: More than three times a week    Attends Religious Services: Never    Database Administrator or Organizations: No    Attends Banker Meetings: Never    Marital Status: Never married  Intimate Partner Violence: Not At Risk (01/05/2024)   Epic    Fear of Current or  Ex-Partner: No    Emotionally Abused: No    Physically Abused: No    Sexually Abused: No  Depression (PHQ2-9): Low Risk (01/05/2024)   Depression (PHQ2-9)    PHQ-2 Score: 0  Alcohol Screen: Low Risk (01/05/2024)   Alcohol Screen    Last Alcohol Screening Score (AUDIT): 0  Housing: Unknown (01/05/2024)   Epic    Unable to Pay for Housing in the Last Year: No    Number of Times Moved in the Last Year: Not on file    Homeless in the Last Year: No  Utilities: Not At Risk (01/05/2024)   Epic    Threatened with loss of utilities: No  Health Literacy: Adequate Health Literacy (01/05/2024)   B1300 Health Literacy    Frequency of need for help with medical instructions: Never     Review of Systems  Neurological:  Positive for dizziness.  All other systems reviewed and are negative.      Objective:   Physical Exam Constitutional:      General: She is not in acute distress.    Appearance: Normal appearance. She is normal weight. She is not ill-appearing or toxic-appearing.  HENT:     Right Ear: Tympanic membrane and ear canal normal.     Left Ear: Tympanic membrane and ear canal normal.     Nose: Congestion present. No rhinorrhea.     Left Sinus: Maxillary sinus tenderness and frontal sinus tenderness present.     Mouth/Throat:     Mouth: Mucous membranes are moist.     Pharynx: No oropharyngeal exudate or posterior oropharyngeal erythema.  Eyes:     General: Lids are everted, no foreign bodies appreciated. Vision grossly intact. Gaze aligned appropriately. No visual field deficit or scleral icterus.       Right eye: No foreign body, discharge or hordeolum.        Left eye: No foreign body, discharge or hordeolum.     Conjunctiva/sclera: Conjunctivae normal.     Right eye: Right conjunctiva is not injected. No chemosis or exudate.    Left eye: Left conjunctiva is not injected. No chemosis or exudate. Cardiovascular:     Rate and Rhythm: Normal rate and regular rhythm.      Heart sounds: Normal heart sounds.  Pulmonary:     Effort: Pulmonary effort is normal.  Neurological:     Mental Status: She is alert and oriented to person, place, and time. Mental status is at baseline.     Cranial Nerves: Cranial nerves 2-12 are intact. No cranial nerve deficit, dysarthria or facial asymmetry.     Sensory: Sensation is intact.     Motor: Tremor present.  No weakness, atrophy, abnormal muscle tone, seizure activity or pronator drift.     Coordination: Coordination is intact. Romberg sign negative. Coordination normal. Finger-Nose-Finger Test normal. Rapid alternating movements normal.     Gait: Gait is intact.           Assessment & Plan:  Left maxillary sinusitis Start the patient on Augmentin  875 mg twice daily for 10 days.  Add prednisone  taper pack.  Continue Flonase.  Recheck in 1 week if no better or sooner if worsening. "

## 2024-04-11 ENCOUNTER — Other Ambulatory Visit: Payer: Self-pay | Admitting: Family Medicine

## 2024-04-11 ENCOUNTER — Telehealth: Payer: Self-pay | Admitting: Family Medicine

## 2024-04-11 MED ORDER — FLUCONAZOLE 150 MG PO TABS: 150.0000 mg | ORAL_TABLET | Freq: Once | ORAL | 0 refills | Status: AC

## 2024-04-11 NOTE — Telephone Encounter (Signed)
 Copied from CRM [id number]. Topic: Clinical - Medication Question >> Apr 11, 2024  8:48 AM Kevelyn M wrote: Reason for CRM: Patient normally gets a medication for a yeast infection after taking antibiotic but did not get that this time and it requesting it. She is not finished with her antibiotics but would like to have it on hand. Can respond through Mychart.

## 2024-04-17 ENCOUNTER — Ambulatory Visit

## 2024-04-28 ENCOUNTER — Ambulatory Visit: Admission: RE | Admit: 2024-04-28 | Source: Ambulatory Visit

## 2024-04-28 ENCOUNTER — Ambulatory Visit

## 2024-04-28 DIAGNOSIS — Z1231 Encounter for screening mammogram for malignant neoplasm of breast: Secondary | ICD-10-CM

## 2025-01-10 ENCOUNTER — Ambulatory Visit
